# Patient Record
Sex: Male | Born: 1945
Health system: Southern US, Community
[De-identification: ages and names within clinical notes are randomized; demographics above are authoritative.]

## PROBLEM LIST (undated history)

## (undated) DIAGNOSIS — M1990 Unspecified osteoarthritis, unspecified site: Secondary | ICD-10-CM

## (undated) DIAGNOSIS — C189 Malignant neoplasm of colon, unspecified: Secondary | ICD-10-CM

## (undated) DIAGNOSIS — E1165 Type 2 diabetes mellitus with hyperglycemia: Secondary | ICD-10-CM

## (undated) DIAGNOSIS — N529 Male erectile dysfunction, unspecified: Secondary | ICD-10-CM

## (undated) DIAGNOSIS — K635 Polyp of colon: Secondary | ICD-10-CM

## (undated) DIAGNOSIS — R011 Cardiac murmur, unspecified: Secondary | ICD-10-CM

## (undated) DIAGNOSIS — I10 Essential (primary) hypertension: Secondary | ICD-10-CM

## (undated) HISTORY — PX: COLONOSCOPY: SHX174

## (undated) HISTORY — DX: Malignant neoplasm of colon, unspecified: C18.9

## (undated) HISTORY — DX: Type 2 diabetes mellitus with hyperglycemia: E11.65

## (undated) HISTORY — DX: Polyp of colon: K63.5

## (undated) HISTORY — DX: Cardiac murmur, unspecified: R01.1

## (undated) HISTORY — DX: Male erectile dysfunction, unspecified: N52.9

## (undated) HISTORY — DX: Unspecified osteoarthritis, unspecified site: M19.90

## (undated) HISTORY — DX: Essential (primary) hypertension: I10

---

## 1978-01-05 HISTORY — PX: HERNIA REPAIR: SHX51

## 2006-01-05 DIAGNOSIS — C189 Malignant neoplasm of colon, unspecified: Secondary | ICD-10-CM

## 2006-01-05 HISTORY — DX: Malignant neoplasm of colon, unspecified: C18.9

## 2006-01-19 HISTORY — PX: OTHER SURGICAL HISTORY: SHX169

## 2009-12-05 LAB — HM COLONOSCOPY: HM Colonoscopy: ABNORMAL

## 2010-05-05 ENCOUNTER — Other Ambulatory Visit (INDEPENDENT_AMBULATORY_CARE_PROVIDER_SITE_OTHER): Payer: BC Managed Care – PPO | Admitting: Internal Medicine

## 2010-05-05 ENCOUNTER — Ambulatory Visit (INDEPENDENT_AMBULATORY_CARE_PROVIDER_SITE_OTHER): Payer: BC Managed Care – PPO | Admitting: Internal Medicine

## 2010-05-05 ENCOUNTER — Other Ambulatory Visit (INDEPENDENT_AMBULATORY_CARE_PROVIDER_SITE_OTHER): Payer: BC Managed Care – PPO

## 2010-05-05 ENCOUNTER — Encounter: Payer: Self-pay | Admitting: Internal Medicine

## 2010-05-05 VITALS — BP 110/64 | HR 96 | Temp 97.5°F | Ht 70.0 in | Wt 256.2 lb

## 2010-05-05 DIAGNOSIS — IMO0001 Reserved for inherently not codable concepts without codable children: Secondary | ICD-10-CM

## 2010-05-05 DIAGNOSIS — Z13220 Encounter for screening for lipoid disorders: Secondary | ICD-10-CM

## 2010-05-05 DIAGNOSIS — Z0000 Encounter for general adult medical examination without abnormal findings: Secondary | ICD-10-CM

## 2010-05-05 DIAGNOSIS — I10 Essential (primary) hypertension: Secondary | ICD-10-CM | POA: Insufficient documentation

## 2010-05-05 DIAGNOSIS — N529 Male erectile dysfunction, unspecified: Secondary | ICD-10-CM

## 2010-05-05 DIAGNOSIS — K635 Polyp of colon: Secondary | ICD-10-CM | POA: Insufficient documentation

## 2010-05-05 DIAGNOSIS — C189 Malignant neoplasm of colon, unspecified: Secondary | ICD-10-CM | POA: Insufficient documentation

## 2010-05-05 DIAGNOSIS — M1990 Unspecified osteoarthritis, unspecified site: Secondary | ICD-10-CM

## 2010-05-05 DIAGNOSIS — R011 Cardiac murmur, unspecified: Secondary | ICD-10-CM | POA: Insufficient documentation

## 2010-05-05 DIAGNOSIS — E119 Type 2 diabetes mellitus without complications: Secondary | ICD-10-CM | POA: Insufficient documentation

## 2010-05-05 HISTORY — DX: Unspecified osteoarthritis, unspecified site: M19.90

## 2010-05-05 HISTORY — DX: Male erectile dysfunction, unspecified: N52.9

## 2010-05-05 HISTORY — DX: Reserved for inherently not codable concepts without codable children: IMO0001

## 2010-05-05 LAB — CBC WITH DIFFERENTIAL/PLATELET
Eosinophils Relative: 3.1 % (ref 0.0–5.0)
HCT: 44.1 % (ref 39.0–52.0)
Hemoglobin: 15.5 g/dL (ref 13.0–17.0)
Lymphs Abs: 2.3 10*3/uL (ref 0.7–4.0)
Monocytes Relative: 8 % (ref 3.0–12.0)
Platelets: 214 10*3/uL (ref 150.0–400.0)
WBC: 8.3 10*3/uL (ref 4.5–10.5)

## 2010-05-05 LAB — HEPATIC FUNCTION PANEL
ALT: 48 U/L (ref 0–53)
AST: 37 U/L (ref 0–37)
Bilirubin, Direct: 0.1 mg/dL (ref 0.0–0.3)
Total Bilirubin: 0.7 mg/dL (ref 0.3–1.2)

## 2010-05-05 LAB — URINALYSIS, ROUTINE W REFLEX MICROSCOPIC
Leukocytes, UA: NEGATIVE
Nitrite: NEGATIVE
Specific Gravity, Urine: >=1.03 (ref 1.000–1.030)
Urine Glucose: NEGATIVE
Urobilinogen, UA: 0.2 (ref 0.0–1.0)

## 2010-05-05 LAB — BASIC METABOLIC PANEL
BUN: 24 mg/dL — ABNORMAL HIGH (ref 6–23)
Chloride: 105 mEq/L (ref 96–112)
GFR: 70.02 mL/min (ref >60.00)
Potassium: 4.4 mEq/L (ref 3.5–5.1)
Sodium: 137 mEq/L (ref 135–145)

## 2010-05-05 LAB — TSH: TSH: 0.72 u[IU]/mL (ref 0.35–5.50)

## 2010-05-05 LAB — LIPID PANEL
HDL: 29.5 mg/dL — ABNORMAL LOW (ref >39.00)
Total CHOL/HDL Ratio: 5
VLDL: 76 mg/dL — ABNORMAL HIGH (ref 0.0–40.0)

## 2010-05-05 LAB — PSA: PSA: 0.52 ng/mL (ref 0.10–4.00)

## 2010-05-05 LAB — LDL CHOLESTEROL, DIRECT: Direct LDL: 78.1 mg/dL

## 2010-05-05 LAB — MICROALBUMIN / CREATININE URINE RATIO: Microalb Creat Ratio: 1.2 mg/g (ref 0.0–30.0)

## 2010-05-05 MED ORDER — TRAMADOL HCL 50 MG PO TABS: 50.0000 mg | ORAL_TABLET | Freq: Four times a day (QID) | ORAL | Status: AC | PRN

## 2010-05-05 MED ORDER — METFORMIN HCL ER 500 MG PO TB24: 1000.0000 mg | ORAL_TABLET | Freq: Every day | ORAL | Status: DC

## 2010-05-05 MED ORDER — TADALAFIL 20 MG PO TABS: 20.0000 mg | ORAL_TABLET | Freq: Every day | ORAL | Status: DC | PRN

## 2010-05-05 MED ORDER — TADALAFIL 20 MG PO TABS: 20.0000 mg | ORAL_TABLET | Freq: Every day | ORAL | Status: AC | PRN

## 2010-05-05 NOTE — Progress Notes (Signed)
Subjective:    Patient ID: Dakota Abbott, male    DOB: March 15, 1945, 65 y.o.   MRN: [id number]  HPI Here for wellness and f/u;  Overall doing ok;  Pt denies CP, worsening SOB, DOE, wheezing, orthopnea, PND, worsening LE edema, palpitations, dizziness or syncope.  Pt denies neurological change such as new Headache, facial or extremity weakness.  Pt denies polydipsia, polyuria, or low sugar symptoms. Pt states overall good compliance with treatment and medications, good tolerability, and trying to follow lower cholesterol diet.  Pt denies worsening depressive symptoms, suicidal ideation or panic. No fever, wt loss, night sweats, loss of appetite, or other constitutional symptoms.  Pt states good ability with ADL's, low fall risk, home safety reviewed and adequate, no significant changes in hearing or vision, and occasionally active with exercise.  Did have glimeparide added per last PCP last yr.  Also request pain med for DJD, as well as cialis for ED symptoms worse in the past 6 mo Past Medical History  Diagnosis Date  . Colon polyps     due for f/u colonoscopy march 2015; Dr Dayton Bailiff  . Arthritis   . Diabetes mellitus   . Heart murmur   . Hypertension   . Colon cancer   . Type II or unspecified type diabetes mellitus without mention of complication, uncontrolled 05/05/2010  . Degenerative joint disease 05/05/2010  . Erectile dysfunction 05/05/2010   Past Surgical History  Procedure Date  . Colon surgury 01/19/2006    reports that he has quit smoking. He does not have any smokeless tobacco history on file. He reports that he does not drink alcohol or use illicit drugs. family history includes Arthritis in his father and mother; Diabetes in his sister; Heart disease in his father and mother; and Hypertension in his father and mother. No Known Allergies No current outpatient prescriptions on file prior to visit.   Review of Systems Review of Systems  Constitutional: Negative for diaphoresis,  activity change, appetite change and unexpected weight change.  HENT: Negative for hearing loss, ear pain, facial swelling, mouth sores and neck stiffness.   Eyes: Negative for pain, redness and visual disturbance.  Respiratory: Negative for shortness of breath and wheezing.   Cardiovascular: Negative for chest pain and palpitations.  Gastrointestinal: Negative for diarrhea, blood in stool, abdominal distention and rectal pain.  Genitourinary: Negative for hematuria, flank pain and decreased urine volume.  Musculoskeletal: Negative for myalgias and joint swelling.  Skin: Negative for color change and wound.  Neurological: Negative for syncope and numbness.  Hematological: Negative for adenopathy.  Psychiatric/Behavioral: Negative for hallucinations, self-injury, decreased concentration and agitation.      Objective:   Physical Exam BP 110/64  Pulse 96  Temp(Src) 97.5 F (36.4 C) (Oral)  Ht 5\' 10"  (1.778 m)  Wt 256 lb 4 oz (116.234 kg)  BMI 36.77 kg/m2  SpO2 95% Physical Exam  VS noted, obese Constitutional: Pt is oriented to person, place, and time. Appears well-developed and well-nourished.  HENT:  Head: Normocephalic and atraumatic.  Right Ear: External ear normal.  Left Ear: External ear normal.  Nose: Nose normal.  Mouth/Throat: Oropharynx is clear and moist.  Eyes: Conjunctivae and EOM are normal. Pupils are equal, round, and reactive to light.  Neck: Normal range of motion. Neck supple. No JVD present. No tracheal deviation present.  Cardiovascular: Normal rate, regular rhythm, normal heart sounds and intact distal pulses.   Pulmonary/Chest: Effort normal and breath sounds normal.  Abdominal: Soft. Bowel sounds are normal.  There is no tenderness.  Musculoskeletal: Normal range of motion. Exhibits no edema.  Lymphadenopathy:  Has no cervical adenopathy.  Neurological: Pt is alert and oriented to person, place, and time. Pt has normal reflexes. No cranial nerve deficit.    Skin: Skin is warm and dry. No rash noted.  Psychiatric:  Has  normal mood and affect. Behavior is normal.         Assessment & Plan:

## 2010-05-05 NOTE — Assessment & Plan Note (Signed)
Mild to mod knees, back and shoulders - for tramadol prn

## 2010-05-05 NOTE — Assessment & Plan Note (Signed)
Due for oncology f/u July 2012 - will refer

## 2010-05-05 NOTE — Patient Instructions (Addendum)
You will be contacted regarding the referral for: the oncology appt for July 2012 Please go to LAB in the Basement for the blood and/or urine tests to be done today Please call the number on the [employer] Card (the [employer]) for results of testing in 2-3 days You are due for colonscopy next in December 2014 Take all new medications as prescribed Continue all other medications as before Please return in 1 year for your yearly visit, or sooner if needed, with Lab testing done 3-5 days before

## 2010-05-05 NOTE — Assessment & Plan Note (Signed)

## 2010-05-05 NOTE — Assessment & Plan Note (Signed)
For cialis prn 

## 2010-05-05 NOTE — Assessment & Plan Note (Signed)
stable overall by hx and exam, most recent lab reviewed with pt, and pt to continue medical treatment as before, for labs today

## 2010-06-30 ENCOUNTER — Encounter: Payer: BC Managed Care – PPO | Admitting: Oncology

## 2010-07-31 ENCOUNTER — Other Ambulatory Visit: Payer: Self-pay

## 2010-07-31 MED ORDER — AMLODIPINE BESYLATE 5 MG PO TABS: 5.0000 mg | ORAL_TABLET | Freq: Every day | ORAL | Status: DC

## 2010-09-29 ENCOUNTER — Other Ambulatory Visit: Payer: Self-pay | Admitting: Internal Medicine

## 2010-09-29 NOTE — Telephone Encounter (Signed)
Pneumovax rx sent to [facility]

## 2010-10-22 ENCOUNTER — Telehealth: Payer: Self-pay | Admitting: *Deleted

## 2010-10-22 NOTE — Telephone Encounter (Signed)
Pt spouse left message stating that pt has been irregular lately for about a 3 weeks and has some constipation-pt does have previous history of colon cancer and wife is asking MD's advisement on what pt can do to become more regular (pt does take Fiber daily per wife)

## 2010-10-22 NOTE — Telephone Encounter (Signed)
Trying to stay active, avoid dehydration, regular meals with more roughage and fiber, metamucil daily, and take miralax OTC daily

## 2010-10-23 NOTE — Telephone Encounter (Signed)
Marcelino Duster advised of MD's recommendations

## 2010-10-27 ENCOUNTER — Telehealth: Payer: Self-pay

## 2010-10-27 NOTE — Telephone Encounter (Signed)
Patients wife call to question if patient would need a referral to GI based on phone note from 10/22/2010. Also the patients wife is requesting something for weight loss for herself. Please advise, call back number is [phone]

## 2010-11-11 ENCOUNTER — Telehealth: Payer: Self-pay | Admitting: Oncology

## 2010-11-11 NOTE — Telephone Encounter (Signed)
Pt's wife called in to r/s her husband's new apt appt that have cancelled. R/s the appts with the pt's wife for jan at their request.

## 2010-11-12 ENCOUNTER — Other Ambulatory Visit: Payer: Self-pay

## 2010-11-12 MED ORDER — LOSARTAN POTASSIUM 100 MG PO TABS: 100.0000 mg | ORAL_TABLET | Freq: Every day | ORAL | Status: DC

## 2010-11-12 MED ORDER — GLIMEPIRIDE 4 MG PO TABS: 4.0000 mg | ORAL_TABLET | Freq: Every day | ORAL | Status: DC

## 2010-11-25 ENCOUNTER — Telehealth: Payer: Self-pay

## 2010-11-25 MED ORDER — METFORMIN HCL ER 500 MG PO TB24: ORAL_TABLET | ORAL | Status: DC

## 2010-11-25 NOTE — Telephone Encounter (Signed)
I think you sent this to the wrong person

## 2010-11-25 NOTE — Telephone Encounter (Signed)
Patients wife called to inform the patient takes Metformin 500 ER 1000 every morning and 1000 mg every evening. Please advise as prescription was for 500 every morning and 500 mg every evening.

## 2010-11-25 NOTE — Telephone Encounter (Signed)
Adjusted medication per patient and MD.

## 2010-11-25 NOTE — Telephone Encounter (Signed)
Ok to adjust rx as above - to robin to handle

## 2011-01-07 ENCOUNTER — Other Ambulatory Visit: Payer: Self-pay

## 2011-01-07 MED ORDER — METFORMIN HCL ER 500 MG PO TB24: ORAL_TABLET | ORAL | Status: DC

## 2011-01-07 MED ORDER — AMLODIPINE BESYLATE 5 MG PO TABS: 5.0000 mg | ORAL_TABLET | Freq: Every day | ORAL | Status: DC

## 2011-01-07 MED ORDER — LOSARTAN POTASSIUM 100 MG PO TABS: 100.0000 mg | ORAL_TABLET | Freq: Every day | ORAL | Status: DC

## 2011-01-07 MED ORDER — GLIMEPIRIDE 4 MG PO TABS: 4.0000 mg | ORAL_TABLET | Freq: Every day | ORAL | Status: DC

## 2011-01-30 ENCOUNTER — Telehealth: Payer: Self-pay | Admitting: Oncology

## 2011-01-30 NOTE — Telephone Encounter (Signed)
ptrs wife called lmovm that pts appt on 01/28 has to be r/s, rtn call lmovm to rtn call to r/s appt

## 2011-02-02 ENCOUNTER — Ambulatory Visit: Payer: BC Managed Care – PPO

## 2011-02-02 ENCOUNTER — Ambulatory Visit: Payer: BC Managed Care – PPO | Admitting: Oncology

## 2011-02-02 ENCOUNTER — Other Ambulatory Visit: Payer: BC Managed Care – PPO | Admitting: Lab

## 2011-02-02 ENCOUNTER — Telehealth: Payer: Self-pay | Admitting: Oncology

## 2011-02-02 NOTE — Telephone Encounter (Signed)
called pts wife and r/s missed appt for 01/28 to 02/12

## 2011-02-13 ENCOUNTER — Telehealth: Payer: Self-pay | Admitting: *Deleted

## 2011-02-13 NOTE — Telephone Encounter (Signed)
Per Dr. Kalman Drape request, called pt's wife to confirm appt for 02/17/11. No answer. Left message for her to call office to confirm.

## 2011-02-13 NOTE — Telephone Encounter (Signed)
Message from pt's wife, they plan to keep appt for 02/17/11.

## 2011-02-16 ENCOUNTER — Other Ambulatory Visit: Payer: Self-pay | Admitting: *Deleted

## 2011-02-16 DIAGNOSIS — C189 Malignant neoplasm of colon, unspecified: Secondary | ICD-10-CM

## 2011-02-17 ENCOUNTER — Ambulatory Visit (HOSPITAL_BASED_OUTPATIENT_CLINIC_OR_DEPARTMENT_OTHER): Payer: BC Managed Care – PPO | Admitting: Oncology

## 2011-02-17 ENCOUNTER — Encounter: Payer: Self-pay | Admitting: Oncology

## 2011-02-17 ENCOUNTER — Other Ambulatory Visit: Payer: BC Managed Care – PPO | Admitting: Lab

## 2011-02-17 ENCOUNTER — Telehealth: Payer: Self-pay

## 2011-02-17 ENCOUNTER — Ambulatory Visit: Payer: BC Managed Care – PPO

## 2011-02-17 ENCOUNTER — Other Ambulatory Visit: Payer: Self-pay | Admitting: *Deleted

## 2011-02-17 VITALS — BP 125/65 | HR 86 | Temp 97.1°F | Ht 71.0 in | Wt 262.5 lb

## 2011-02-17 DIAGNOSIS — C189 Malignant neoplasm of colon, unspecified: Secondary | ICD-10-CM

## 2011-02-17 DIAGNOSIS — C187 Malignant neoplasm of sigmoid colon: Secondary | ICD-10-CM

## 2011-02-17 DIAGNOSIS — E119 Type 2 diabetes mellitus without complications: Secondary | ICD-10-CM

## 2011-02-17 LAB — CBC WITH DIFFERENTIAL/PLATELET
Eosinophils Absolute: 0.3 10*3/uL (ref 0.0–0.5)
HGB: 14.8 g/dL (ref 13.0–17.1)
MONO#: 0.6 10*3/uL (ref 0.1–0.9)
MONO%: 7.3 % (ref 0.0–14.0)
NEUT#: 5.4 10*3/uL (ref 1.5–6.5)
RBC: 4.5 10*6/uL (ref 4.20–5.82)
RDW: 12.6 % (ref 11.0–14.6)
WBC: 8.3 10*3/uL (ref 4.0–10.3)
lymph#: 2 10*3/uL (ref 0.9–3.3)

## 2011-02-17 NOTE — Telephone Encounter (Signed)
Are we sure the request is for testosterone;  I though there was a mention of Muse I overheard from the phone discussion

## 2011-02-17 NOTE — Telephone Encounter (Signed)
Called the patient and she stated the MD in [place] said no more that 2-3 times per month. Patients wife stated is was a powder form

## 2011-02-17 NOTE — Progress Notes (Signed)
Referring MD: Susie Cassette 66 y.o.  08-Oct-1945    Reason for Referral: History of colon cancer.     HPI: He was diagnosed with colon cancer in January of 2008 after presenting with diarrhea and rectal bleeding. He underwent a colonoscopy that confirmed a mass at 25 cm. He was taken to the operating room on 01/19/2006 and underwent a partial colectomy.the mass was found in the sigmoid colon. The pathology revealed a tumor at the rectal sigmoid colon measuring 9.2 cm in greatest dimension. The tumor was a moderately differentiated adenocarcinoma. The tumor was characterized as a T3 N0 lesion with a total of 15 negative lymph nodes. The surgical margins were negative. Lymphatic and venous invasion were absent. Perineural invasion was present.a markedly lymphocytic response was noted including a "Crohn's like response ". A hyperplastic polyp and a "adenoma "were also noted in the surgical specimen the  He did not receive adjuvant therapy. He has been followed  With surveillance colonoscopies.  He reports feeling well at present.   Past Medical History  Diagnosis Date  . Colon polyps     due for f/u colonoscopy march 2015; Dr Dayton Bailiff  . Arthritis   . Heart murmur   . Hypertension   . Colon cancer (T3 N0)   January 2008   . Type II or unspecified type diabetes mellitus without mention of complication, uncontrolled 05/05/2010  . Degenerative joint disease 05/05/2010  . Erectile dysfunction 05/05/2010  . Diabetes mellitus     Past Surgical History  Procedure Date  . Colon surgury 01/19/2006   .right inguinal hernia repair   Family History  Problem Relation Age of Onset  . Arthritis Mother   . Heart disease Mother   . Hypertension Mother   . Arthritis Father   . Heart disease Father   . Hypertension Father   . Diabetes Sister    . His father died at age 73, he believes he may have had colon cancer  . No other family history of cancer, he has 2 daughters, one  brother, and one sister. One brother is deceased. Current outpatient prescriptions:amLODipine (NORVASC) 5 MG tablet, Take 1 tablet (5 mg total) by mouth daily., Disp: 90 tablet, Rfl: 3;  aspirin 81 MG EC tablet, Take 81 mg by mouth daily.  , Disp: , Rfl: ;  glimepiride (AMARYL) 4 MG tablet, Take 1 tablet (4 mg total) by mouth daily., Disp: 90 tablet, Rfl: 2;  losartan (COZAAR) 100 MG tablet, Take 1 tablet (100 mg total) by mouth daily., Disp: 90 tablet, Rfl: 2 metFORMIN (GLUCOPHAGE-XR) 500 MG 24 hr tablet, Take 1000 mg every morning and 1000 mg every evening., Disp: 120 tablet, Rfl: 11;  Multiple Vitamin (MULTIVITAMIN) tablet, Take 1 tablet by mouth daily.  , Disp: , Rfl: ;  traMADol (ULTRAM) 50 MG tablet, Take 1 tablet (50 mg total) by mouth every 6 (six) hours as needed for pain., Disp: 120 tablet, Rfl: 3  Allergies: No Known Allergies  Social History:  he lives with his wife in [place]. He works as a Engineer, maintenance. He quit smoking cigarettes 8 years ago. He does not use alcohol. He has no transfusion history. He denies risk factors for HIV and hepatitis. He was in the [employer] in [place].    History  Alcohol Use No    History  Smoking status  . Former Smoker  Smokeless tobacco  . Not on file       ROS:  Positives include: occasional constipation, relieved with increased water intake and yogurt  A complete ROS was otherwise negative.  Physical Exam:  Blood pressure 125/65, pulse 86, temperature 97.1 F (36.2 C), temperature source Oral, height 5\' 11"  (1.803 m), weight 262 lb 8 oz (119.069 kg).  HEENT:  upper denture plate, oropharynx without visible mass, neck without mass Lungs:  end inspiratory bronchial sounds at the upper posterior chest bilaterally. No respiratory distress  Cardiac:  Regular rate and rhythm, 2/6 systolic murmur heard best at the apex and left anterolateral chest Abdomen:  nontender, no hepatomegaly, no mass, no apparent ascites GU:  testes  without mass, the right testicle is smaller than the left   Vascular: No leg  Lymph nodes: No cervical, supraclavicular, axillary, or inguinal lymph nodes  Neurologic:  alert and oriented. The motor examination appears grossly intact.    LAB:  CBC  Lab Results  Component Value Date   WBC 8.3 02/17/2011   HGB 14.8 02/17/2011   HCT 42.2 02/17/2011   MCV 93.7 02/17/2011   PLT 171 02/17/2011     CEA- pending   Assessment/Plan:   1. Stage II (T3 N0) adenocarcinoma of the sigmoid colon, status post a partial colectomy in January of 2008  2. Marked lymphocytic response and " Crohn like "response noted on the January 2008 surgical pathology   3. Diabetes mellitus  4.? History of colon cancer in his father   Disposition:   He was diagnosed with stage II colon cancer 5 years ago. He remains in clinical remission. He should continue surveillance colonoscopies. We will check a CEA today. He has an excellent prognosis for a long-term disease-free survival.  His father may have had colon cancer and a significant lymphocytic response was noted on the 2008 colon cancer. This could indicate a hereditary non-polyposis colon cancer syndrome. We will submit peripheral blood testing for hereditary non-polyposis colon cancer. We will contact him with these results and arrange for a genetics  Counseling referral as indicated. He will alert family members of his colon cancer diagnosis so they can obtain a screening colonoscopy.  He plans to continue clinical followup with Dr. Jonny Ruiz and surveillance colonoscopies at [facility] gastro[facility]. We did not schedule a followup appointment at cancer Center. We will see him in the future as needed.   Ilyanna Baillargeon BRADLEY 02/17/2011, 2:59 PM

## 2011-02-17 NOTE — Telephone Encounter (Signed)
Patients wife called to inquire the patient starting back on testosterone treatment (injections, she stated was a powder form) that he had 1 year ago in [place]. Please advise if the patient needs OV? Call back number is [phone]

## 2011-02-17 NOTE — Progress Notes (Signed)
Checks glucose 2-3 times daily: ranges 90's to 140's

## 2011-02-17 NOTE — Telephone Encounter (Signed)
Can she let us know how often the injection was done? (usually every 2 wks, or 4 wks)

## 2011-02-18 ENCOUNTER — Other Ambulatory Visit: Payer: Self-pay | Admitting: *Deleted

## 2011-02-18 DIAGNOSIS — C189 Malignant neoplasm of colon, unspecified: Secondary | ICD-10-CM

## 2011-02-18 NOTE — Telephone Encounter (Signed)
Called the patient and she will call the pharmacy where medication was filled to get name, dose and instructions.

## 2011-02-18 NOTE — Telephone Encounter (Signed)
Called left message to call back 

## 2011-02-18 NOTE — Progress Notes (Signed)
Dr. Truett Perna called patient to discuss his past clinical history in more detail and felt it would be of benefit to retest for CEA and draw peripheral blood and have tested for [facility] testing Dakota Abbott). Patient agrees. Scheduler notified.

## 2011-02-19 ENCOUNTER — Telehealth: Payer: Self-pay | Admitting: Oncology

## 2011-02-19 MED ORDER — ALPROSTADIL (VASODILATOR) 250 MCG UR PLLT: 250.0000 ug | PELLET | URETHRAL | Status: DC | PRN

## 2011-02-19 NOTE — Telephone Encounter (Signed)
called pts wife to scheduled lab for today and she stated that they were on there out of town and will rtn on 03/04.  scheduled lab appt for 03/04

## 2011-02-19 NOTE — Telephone Encounter (Signed)
The patients wife called back and the name of the medication is Muse. She does not know the amount only stated the maximum amount, please advise.

## 2011-02-19 NOTE — Telephone Encounter (Signed)
To be clear, MUSE is a penile suppository medication for use only for ED, on a prn basis  ok for rx, though please be aware there can be difficulty with coverage by some insurances  It comes in 3 different strengths - ok for medium strength I should think to start;Done hardcopy to robin to give to pt as they may wish to have filled locally first

## 2011-02-19 NOTE — Telephone Encounter (Signed)
Called the patients wife informed of information and faxed hardcopy to pharmacy.

## 2011-02-23 NOTE — Progress Notes (Signed)
Patient was a no-show 

## 2011-03-09 ENCOUNTER — Other Ambulatory Visit: Payer: BC Managed Care – PPO | Admitting: Lab

## 2011-05-08 ENCOUNTER — Encounter: Payer: BC Managed Care – PPO | Admitting: Internal Medicine

## 2011-06-15 ENCOUNTER — Other Ambulatory Visit: Payer: Self-pay

## 2011-06-15 ENCOUNTER — Other Ambulatory Visit: Payer: Self-pay | Admitting: Internal Medicine

## 2011-06-15 NOTE — Telephone Encounter (Signed)
Denied-Rx to [facility] 02.14.13 #[id number]-too soon to fill/SLS

## 2011-06-15 NOTE — Telephone Encounter (Signed)
Spouse called requesting Rx for Muse be sent to [facility]. Refill request received was denied because per [facility] hardcopy Rx was faxed to [facility] when it actually went to local. Raynelle Fanning at [facility] states that Rx was received but with no refills. Authorization was given for 12 additional fills. Spouse advised

## 2011-06-16 ENCOUNTER — Other Ambulatory Visit: Payer: Self-pay | Admitting: Internal Medicine

## 2011-07-13 ENCOUNTER — Encounter: Payer: BC Managed Care – PPO | Admitting: Internal Medicine

## 2011-07-29 ENCOUNTER — Other Ambulatory Visit: Payer: Self-pay | Admitting: Internal Medicine

## 2011-08-05 ENCOUNTER — Other Ambulatory Visit: Payer: Self-pay | Admitting: Internal Medicine

## 2011-08-15 ENCOUNTER — Other Ambulatory Visit: Payer: Self-pay | Admitting: Internal Medicine

## 2011-08-17 NOTE — Telephone Encounter (Signed)
Done erx  Dakota Abbott to contact pt - needs OV

## 2011-08-18 ENCOUNTER — Other Ambulatory Visit: Payer: Self-pay | Admitting: Internal Medicine

## 2011-09-09 ENCOUNTER — Other Ambulatory Visit: Payer: Self-pay | Admitting: Internal Medicine

## 2011-09-25 ENCOUNTER — Other Ambulatory Visit: Payer: Self-pay | Admitting: Internal Medicine

## 2011-09-29 ENCOUNTER — Encounter: Payer: BC Managed Care – PPO | Admitting: Internal Medicine

## 2011-10-11 ENCOUNTER — Other Ambulatory Visit: Payer: Self-pay | Admitting: Internal Medicine

## 2011-11-02 ENCOUNTER — Encounter: Payer: BC Managed Care – PPO | Admitting: Internal Medicine

## 2011-11-11 ENCOUNTER — Other Ambulatory Visit: Payer: Self-pay

## 2011-11-11 MED ORDER — ASPIRIN 81 MG PO TBEC: 81.0000 mg | DELAYED_RELEASE_TABLET | Freq: Every day | ORAL | Status: DC

## 2011-11-11 MED ORDER — GLIMEPIRIDE 4 MG PO TABS: 4.0000 mg | ORAL_TABLET | Freq: Every day | ORAL | Status: DC

## 2011-12-07 ENCOUNTER — Encounter: Payer: Self-pay | Admitting: Internal Medicine

## 2011-12-07 ENCOUNTER — Other Ambulatory Visit (INDEPENDENT_AMBULATORY_CARE_PROVIDER_SITE_OTHER): Payer: BC Managed Care – PPO

## 2011-12-07 ENCOUNTER — Ambulatory Visit (INDEPENDENT_AMBULATORY_CARE_PROVIDER_SITE_OTHER): Payer: BC Managed Care – PPO | Admitting: Internal Medicine

## 2011-12-07 VITALS — BP 122/70 | HR 91 | Temp 98.7°F | Ht 70.0 in | Wt 264.4 lb

## 2011-12-07 DIAGNOSIS — E669 Obesity, unspecified: Secondary | ICD-10-CM | POA: Insufficient documentation

## 2011-12-07 DIAGNOSIS — Z2911 Encounter for prophylactic immunotherapy for respiratory syncytial virus (RSV): Secondary | ICD-10-CM

## 2011-12-07 DIAGNOSIS — C189 Malignant neoplasm of colon, unspecified: Secondary | ICD-10-CM

## 2011-12-07 DIAGNOSIS — Z23 Encounter for immunization: Secondary | ICD-10-CM

## 2011-12-07 DIAGNOSIS — Z0000 Encounter for general adult medical examination without abnormal findings: Secondary | ICD-10-CM

## 2011-12-07 LAB — CBC WITH DIFFERENTIAL/PLATELET
Basophils Relative: 0.6 % (ref 0.0–3.0)
Eosinophils Relative: 3.3 % (ref 0.0–5.0)
HCT: 43.2 % (ref 39.0–52.0)
Hemoglobin: 14.9 g/dL (ref 13.0–17.0)
Lymphs Abs: 1.7 10*3/uL (ref 0.7–4.0)
MCV: 96.5 fl (ref 78.0–100.0)
Monocytes Absolute: 0.5 10*3/uL (ref 0.1–1.0)
Neutro Abs: 5.5 10*3/uL (ref 1.4–7.7)
Neutrophils Relative %: 68.7 % (ref 43.0–77.0)
RBC: 4.48 Mil/uL (ref 4.22–5.81)
WBC: 8 10*3/uL (ref 4.5–10.5)

## 2011-12-07 LAB — LDL CHOLESTEROL, DIRECT: Direct LDL: 85.5 mg/dL

## 2011-12-07 LAB — BASIC METABOLIC PANEL
BUN: 19 mg/dL (ref 6–23)
Calcium: 9.6 mg/dL (ref 8.4–10.5)
Creatinine, Ser: 1.1 mg/dL (ref 0.4–1.5)
GFR: 71.9 mL/min (ref >60.00)

## 2011-12-07 LAB — LIPID PANEL
Cholesterol: 169 mg/dL (ref 0–200)
HDL: 30.3 mg/dL — ABNORMAL LOW (ref >39.00)
Total CHOL/HDL Ratio: 6
VLDL: 111 mg/dL — ABNORMAL HIGH (ref 0.0–40.0)

## 2011-12-07 LAB — TSH: TSH: 0.48 u[IU]/mL (ref 0.35–5.50)

## 2011-12-07 LAB — HEMOGLOBIN A1C: Hgb A1c MFr Bld: 6.3 % (ref 4.6–6.5)

## 2011-12-07 LAB — HEPATIC FUNCTION PANEL: Total Bilirubin: 0.7 mg/dL (ref 0.3–1.2)

## 2011-12-07 MED ORDER — PHENTERMINE HCL 37.5 MG PO CAPS: 37.5000 mg | ORAL_CAPSULE | ORAL | Status: DC

## 2011-12-07 NOTE — Assessment & Plan Note (Signed)
Ok for limited trial phentermine

## 2011-12-07 NOTE — Assessment & Plan Note (Signed)

## 2011-12-07 NOTE — Progress Notes (Signed)
Subjective:    Patient ID: Dakota Abbott, male    DOB: Sep 26, 1945, 66 y.o.   MRN: [id number]  HPI Here for wellness and f/u;  Overall doing ok;  Pt denies CP, worsening SOB, DOE, wheezing, orthopnea, PND, worsening LE edema, palpitations, dizziness or syncope.  Pt denies neurological change such as new Headache, facial or extremity weakness.  Pt denies polydipsia, polyuria, or low sugar symptoms. Pt states overall good compliance with treatment and medications, good tolerability, and trying to follow lower cholesterol diet.  Pt denies worsening depressive symptoms, suicidal ideation or panic. No fever, wt loss, night sweats, loss of appetite, or other constitutional symptoms.  Pt states good ability with ADL's, low fall risk, home safety reviewed and adequate, no significant changes in hearing or vision, and occasionally active with exercise.  No acute complaints Past Medical History  Diagnosis Date  . Colon polyps     due for f/u colonoscopy march 2015; Dr Dayton Bailiff  . Arthritis   . Heart murmur   . Hypertension   . Colon cancer   . Type II or unspecified type diabetes mellitus without mention of complication, uncontrolled 05/05/2010  . Degenerative joint disease 05/05/2010  . Erectile dysfunction 05/05/2010  . Diabetes mellitus    Past Surgical History  Procedure Date  . Colon surgury 01/19/2006    reports that he has quit smoking. He does not have any smokeless tobacco history on file. He reports that he does not drink alcohol or use illicit drugs. family history includes Arthritis in his father and mother; Diabetes in his sister; Heart disease in his father and mother; and Hypertension in his father and mother. No Known Allergies Current Outpatient Prescriptions on File Prior to Visit  Medication Sig Dispense Refill  . amLODipine (NORVASC) 5 MG tablet Take 1 tablet (5 mg total) by mouth daily.  90 tablet  3  . aspirin (RA ASPIRIN EC ADULT LOW ST) 81 MG EC tablet Take 1 tablet (81 mg  total) by mouth daily. Swallow whole.  30 tablet  11  . glimepiride (AMARYL) 4 MG tablet Take 1 tablet (4 mg total) by mouth daily before breakfast.  30 tablet  11  . losartan (COZAAR) 100 MG tablet TAKE 1 TABLET BY MOUTH DAILY  30 tablet  0  . metFORMIN (GLUCOPHAGE-XR) 500 MG 24 hr tablet Take 1000 mg every morning and 1000 mg every evening.  120 tablet  11  . Multiple Vitamin (MULTIVITAMIN) tablet Take 1 tablet by mouth daily.        . phentermine 37.5 MG capsule Take 1 capsule (37.5 mg total) by mouth every morning.  30 capsule  2   Review of Systems Review of Systems  Constitutional: Negative for diaphoresis, activity change, appetite change and unexpected weight change.  HENT: Negative for hearing loss, ear pain, facial swelling, mouth sores and neck stiffness.   Eyes: Negative for pain, redness and visual disturbance.  Respiratory: Negative for shortness of breath and wheezing.   Cardiovascular: Negative for chest pain and palpitations.  Gastrointestinal: Negative for diarrhea, blood in stool, abdominal distention and rectal pain.  Genitourinary: Negative for hematuria, flank pain and decreased urine volume.  Musculoskeletal: Negative for myalgias and joint swelling.  Skin: Negative for color change and wound.  Neurological: Negative for syncope and numbness.  Hematological: Negative for adenopathy.  Psychiatric/Behavioral: Negative for hallucinations, self-injury, decreased concentration and agitation.      Objective:   Physical Exam BP 122/70  Pulse 91  Temp 98.7 F (  37.1 C) (Oral)  Ht 5\' 10"  (1.778 m)  Wt 264 lb 6 oz (119.92 kg)  BMI 37.93 kg/m2  SpO2 96% Physical Exam  VS noted Constitutional: Pt is oriented to person, place, and time. Appears well-developed and well-nourished.  HENT:  Head: Normocephalic and atraumatic.  Right Ear: External ear normal.  Left Ear: External ear normal.  Nose: Nose normal.  Mouth/Throat: Oropharynx is clear and moist.  Eyes:  Conjunctivae and EOM are normal. Pupils are equal, round, and reactive to light.  Neck: Normal range of motion. Neck supple. No JVD present. No tracheal deviation present.  Cardiovascular: Normal rate, regular rhythm, normal heart sounds and intact distal pulses.   Pulmonary/Chest: Effort normal and breath sounds normal.  Abdominal: Soft. Bowel sounds are normal. There is no tenderness.  Musculoskeletal: Normal range of motion. Exhibits no edema.  Lymphadenopathy:  Has no cervical adenopathy.  Neurological: Pt is alert and oriented to person, place, and time. Pt has normal reflexes. No cranial nerve deficit.  Skin: Skin is warm and dry. No rash noted.  Psychiatric:  Has  normal mood and affect. Behavior is normal.     Assessment & Plan:

## 2011-12-07 NOTE — Assessment & Plan Note (Signed)
stable overall by hx and exam, most recent data reviewed with pt, and pt to continue medical treatment as before Lab Results  Component Value Date   HGBA1C 6.3 12/07/2011

## 2011-12-07 NOTE — Patient Instructions (Addendum)
Take all new medications as prescribed - the phentermine Continue all other medications as before Please have the pharmacy call with any refills you may need. Please continue your efforts at being more active, low cholesterol diet, and weight control. You had the shingles shot today You are otherwise up to date with prevention Please go to LAB in the Basement for the blood and/or urine tests to be done today You will be contacted by phone if any changes need to be made immediately.  Otherwise, you will receive a letter about your results with an explanation, but please check with MyChart first. Thank you for enrolling in MyChart. Please follow the instructions below to securely access your online medical record. MyChart allows you to send messages to your doctor, view your test results, renew your prescriptions, schedule appointments, and more. To Log into MyChart, please go to [email or web address], and your Username is: [email or web address] Please keep your appointments with your specialists as you have planned - your dentist to have the teeth pulled Please return in 6 mo with Lab testing done 3-5 days before

## 2011-12-07 NOTE — Assessment & Plan Note (Signed)
Ok for CEA level

## 2011-12-09 ENCOUNTER — Encounter: Payer: Self-pay | Admitting: Internal Medicine

## 2011-12-09 ENCOUNTER — Other Ambulatory Visit: Payer: Self-pay | Admitting: Internal Medicine

## 2011-12-09 MED ORDER — AMOXICILLIN 500 MG PO CAPS: 1000.0000 mg | ORAL_CAPSULE | Freq: Two times a day (BID) | ORAL | Status: DC

## 2011-12-21 ENCOUNTER — Encounter: Payer: BC Managed Care – PPO | Admitting: Internal Medicine

## 2011-12-31 ENCOUNTER — Other Ambulatory Visit: Payer: Self-pay | Admitting: Internal Medicine

## 2012-01-08 ENCOUNTER — Telehealth: Payer: Self-pay

## 2012-01-08 NOTE — Telephone Encounter (Signed)
The patient is having dental work on Jan. 14, 2014 and needs a letter stating he is cleared for this procedure.  THe wife stated we could mail it to their home if possible.  Please advise

## 2012-01-08 NOTE — Telephone Encounter (Signed)
Ok for note - to [employer]

## 2012-01-11 NOTE — Telephone Encounter (Signed)
Completed letter, called the patient to inform and mailed to the home of patient as requested.

## 2012-01-24 ENCOUNTER — Other Ambulatory Visit: Payer: Self-pay | Admitting: Internal Medicine

## 2012-01-24 ENCOUNTER — Encounter: Payer: Self-pay | Admitting: Internal Medicine

## 2012-01-25 ENCOUNTER — Encounter: Payer: Self-pay | Admitting: Internal Medicine

## 2012-01-25 ENCOUNTER — Telehealth: Payer: Self-pay

## 2012-01-25 ENCOUNTER — Other Ambulatory Visit: Payer: Self-pay

## 2012-01-25 ENCOUNTER — Other Ambulatory Visit: Payer: Self-pay | Admitting: Internal Medicine

## 2012-01-25 MED ORDER — LOSARTAN POTASSIUM 100 MG PO TABS: 100.0000 mg | ORAL_TABLET | Freq: Every day | ORAL | Status: DC

## 2012-01-25 MED ORDER — AMOXICILLIN 500 MG PO CAPS: ORAL_CAPSULE | ORAL | Status: DC

## 2012-01-25 NOTE — Telephone Encounter (Signed)
The patients wife called to request an antibiotic sent in to [facility].  There is a note in mychart as well.  States he may need something stronger than amoxicillin and for up to 2 weeks.  Please Francee Piccolo c all back number is [phone]

## 2012-01-25 NOTE — Telephone Encounter (Signed)
Prescription done and called the patients wife informed rx sent in.

## 2012-01-25 NOTE — Telephone Encounter (Signed)
Per pt email:  Ok will do ===View-only below this line===   ----- Message -----    From: Ernestene Kiel    Sent: 01/24/2012  8:55 PM EST      To: Oliver Barre, MD Subject: Non-Urgent Medical Question  Dr. Jonny Ruiz I have not been to my appointment with my dentist until next Tuesday Jan 28. My tooth is infected and very painful. Would u please call me in a prescription for Penicillin with enough to last for 2 weeks  if u can please call them into [facility] on [address]. The phone number is [phone]. Thank you Ernestene Kiel Sent from my iPhone

## 2012-01-25 NOTE — Telephone Encounter (Signed)
I would do the regular tx - already done erx

## 2012-02-11 ENCOUNTER — Other Ambulatory Visit: Payer: Self-pay | Admitting: Internal Medicine

## 2012-02-11 ENCOUNTER — Encounter: Payer: Self-pay | Admitting: Internal Medicine

## 2012-02-11 DIAGNOSIS — K5900 Constipation, unspecified: Secondary | ICD-10-CM

## 2012-02-11 DIAGNOSIS — Z85038 Personal history of other malignant neoplasm of large intestine: Secondary | ICD-10-CM

## 2012-02-16 ENCOUNTER — Encounter: Payer: Self-pay | Admitting: Internal Medicine

## 2012-02-24 ENCOUNTER — Other Ambulatory Visit: Payer: Self-pay | Admitting: Internal Medicine

## 2012-02-25 ENCOUNTER — Telehealth: Payer: Self-pay

## 2012-02-25 MED ORDER — METFORMIN HCL ER (OSM) 1000 MG PO TB24: 1000.0000 mg | ORAL_TABLET | Freq: Two times a day (BID) | ORAL | Status: DC

## 2012-02-25 NOTE — Telephone Encounter (Signed)
refill 

## 2012-03-07 ENCOUNTER — Encounter: Payer: Self-pay | Admitting: Internal Medicine

## 2012-03-09 ENCOUNTER — Ambulatory Visit: Payer: BC Managed Care – PPO | Admitting: Internal Medicine

## 2012-06-03 ENCOUNTER — Encounter: Payer: Self-pay | Admitting: Internal Medicine

## 2012-06-07 ENCOUNTER — Other Ambulatory Visit: Payer: Self-pay | Admitting: Internal Medicine

## 2012-06-07 ENCOUNTER — Other Ambulatory Visit: Payer: Self-pay

## 2012-06-07 MED ORDER — LOSARTAN POTASSIUM 100 MG PO TABS: 100.0000 mg | ORAL_TABLET | Freq: Every day | ORAL | Status: DC

## 2012-06-10 ENCOUNTER — Telehealth: Payer: Self-pay | Admitting: *Deleted

## 2012-06-10 NOTE — Telephone Encounter (Signed)
Robitussin DM ok

## 2012-06-10 NOTE — Telephone Encounter (Signed)
Pt wife states husband has a cough. Was coughing up yellow phelgm, headache, no fever. They are out on the road not able to go to any urgent care to be evaluated. Wanting to see what md recommend he can take over the counter. Started using robitussin last night for cough...Raechel Chute

## 2012-06-10 NOTE — Telephone Encounter (Signed)
Patient informed. 

## 2012-06-30 ENCOUNTER — Ambulatory Visit: Payer: BC Managed Care – PPO | Admitting: Internal Medicine

## 2012-07-06 ENCOUNTER — Ambulatory Visit: Payer: BC Managed Care – PPO | Admitting: Internal Medicine

## 2012-07-07 ENCOUNTER — Other Ambulatory Visit: Payer: Self-pay

## 2012-07-07 MED ORDER — ONE-DAILY MULTI VITAMINS PO TABS: 1.0000 | ORAL_TABLET | Freq: Every day | ORAL | Status: DC

## 2012-07-11 ENCOUNTER — Ambulatory Visit (INDEPENDENT_AMBULATORY_CARE_PROVIDER_SITE_OTHER): Payer: BC Managed Care – PPO | Admitting: Gastroenterology

## 2012-07-11 ENCOUNTER — Encounter: Payer: Self-pay | Admitting: Gastroenterology

## 2012-07-11 VITALS — BP 100/60 | HR 80 | Ht 70.0 in | Wt 263.0 lb

## 2012-07-11 DIAGNOSIS — D126 Benign neoplasm of colon, unspecified: Secondary | ICD-10-CM

## 2012-07-11 DIAGNOSIS — K635 Polyp of colon: Secondary | ICD-10-CM

## 2012-07-11 MED ORDER — NA SULFATE-K SULFATE-MG SULF 17.5-3.13-1.6 GM/177ML PO SOLN: 1.0000 | Freq: Once | ORAL | Status: DC

## 2012-07-11 NOTE — Progress Notes (Signed)
History of Present Illness: The patient is a 67 year old white male with history of colon cancer and colon polyps referred at the request of Dr. Jonny Ruiz for followup colonoscopy. Colon cancer was resected approximately 7 years ago. Last colonoscopy in 2011 demonstrated a hyperplastic and adenomatous polyp. Except for mild constipation he has no GI complaints. He denies rectal bleeding.    Past Medical History  Diagnosis Date  . Colon polyps     due for f/u colonoscopy march 2015; Dr Dayton Bailiff  . Arthritis   . Heart murmur   . Hypertension   . Colon cancer   . Type II or unspecified type diabetes mellitus without mention of complication, uncontrolled 05/05/2010  . Degenerative joint disease 05/05/2010  . Erectile dysfunction 05/05/2010  . Diabetes mellitus    Past Surgical History  Procedure Laterality Date  . Colon surgury  01/19/2006   family history includes Arthritis in his father and mother; Diabetes in his sister; Heart disease in his father and mother; and Hypertension in his father and mother. Current Outpatient Prescriptions  Medication Sig Dispense Refill  . amLODipine (NORVASC) 5 MG tablet TAKE 1 TABLET BY MOUTH DAILY  90 tablet  3  . amoxicillin (AMOXIL) 500 MG capsule TAKE 2 CAPSULES BY MOUTH TWICE DAILY  40 capsule  0  . aspirin (RA ASPIRIN EC ADULT LOW ST) 81 MG EC tablet Take 1 tablet (81 mg total) by mouth daily. Swallow whole.  30 tablet  11  . glimepiride (AMARYL) 4 MG tablet TAKE 1 TABLET BY MOUTH DAILY  90 tablet  3  . losartan (COZAAR) 100 MG tablet Take 1 tablet (100 mg total) by mouth daily.  90 tablet  2  . metformin (FORTAMET) 1000 MG (OSM) 24 hr tablet Take 1 tablet (1,000 mg total) by mouth 2 (two) times daily with a meal.  60 tablet  11  . metFORMIN (GLUCOPHAGE-XR) 500 MG 24 hr tablet TAKE 2 TABLETS (1000MG ) EVERY MORNING AND 2 TABLETS (1000MG ) EVERY EVENING  360 tablet  10  . Multiple Vitamin (MULTIVITAMIN) tablet Take 1 tablet by mouth daily.  30 tablet  5  .  phentermine 37.5 MG capsule Take 1 capsule (37.5 mg total) by mouth every morning.  30 capsule  2   No current facility-administered medications for this visit.   Allergies as of 07/11/2012 - Review Complete 07/11/2012  Allergen Reaction Noted  . Lisinopril Cough 07/11/2012    reports that he has quit smoking. He has never used smokeless tobacco. He reports that he does not drink alcohol or use illicit drugs.     Review of Systems: Pertinent positive and negative review of systems were noted in the above HPI section. All other review of systems were otherwise negative.  Vital signs were reviewed in today's medical record Physical Exam: General: Well developed , well nourished, no acute distress Skin: anicteric Head: Normocephalic and atraumatic Eyes:  sclerae anicteric, EOMI Ears: Normal auditory acuity Mouth: No deformity or lesions Neck: Supple, no masses or thyromegaly Lungs: Clear throughout to auscultation Heart: Regular rate and rhythm; no murmurs, rubs or bruits Abdomen: Soft, non tender and non distended. No masses, hepatosplenomegaly or hernias noted. Normal Bowel sounds Rectal:deferred Musculoskeletal: Symmetrical with no gross deformities  Skin: No lesions on visible extremities Pulses:  Normal pulses noted Extremities: No clubbing, cyanosis, edema or deformities noted Neurological: Alert oriented x 4, grossly nonfocal Cervical Nodes:  No significant cervical adenopathy Inguinal Nodes: No significant inguinal adenopathy Psychological:  Alert and cooperative. Normal  mood and affect

## 2012-07-11 NOTE — Assessment & Plan Note (Signed)
By history adenomatous and hyperplastic polyps in 2011. Plan followup colonoscopy

## 2012-07-11 NOTE — Patient Instructions (Addendum)

## 2012-07-18 ENCOUNTER — Ambulatory Visit: Payer: BC Managed Care – PPO | Admitting: Internal Medicine

## 2012-08-01 ENCOUNTER — Encounter: Payer: BC Managed Care – PPO | Admitting: Gastroenterology

## 2012-08-10 ENCOUNTER — Other Ambulatory Visit: Payer: Self-pay

## 2012-08-11 ENCOUNTER — Encounter: Payer: Self-pay | Admitting: Gastroenterology

## 2012-09-19 ENCOUNTER — Ambulatory Visit (AMBULATORY_SURGERY_CENTER): Payer: BC Managed Care – PPO | Admitting: Gastroenterology

## 2012-09-19 ENCOUNTER — Encounter: Payer: Self-pay | Admitting: Gastroenterology

## 2012-09-19 VITALS — BP 134/76 | HR 68 | Temp 97.9°F | Resp 21 | Ht 70.0 in | Wt 263.0 lb

## 2012-09-19 DIAGNOSIS — Z8500 Personal history of malignant neoplasm of unspecified digestive organ: Secondary | ICD-10-CM

## 2012-09-19 DIAGNOSIS — Z86010 Personal history of colonic polyps: Secondary | ICD-10-CM

## 2012-09-19 DIAGNOSIS — Z85038 Personal history of other malignant neoplasm of large intestine: Secondary | ICD-10-CM

## 2012-09-19 MED ORDER — SODIUM CHLORIDE 0.9 % IV SOLN: 500.0000 mL | INTRAVENOUS | Status: DC

## 2012-09-19 NOTE — Op Note (Signed)
[facility] [address] [place], [address]   COLONOSCOPY PROCEDURE REPORT  PATIENT: Dakota Abbott, Dakota Abbott  MR#: [id number] BIRTHDATE: Jul 03, 1945 , 66  yrs. old GENDER: Male ENDOSCOPIST: Louis Meckel, MD REFERRED OZ:HYQM Larena Sox, M.D. PROCEDURE DATE:  09/19/2012 PROCEDURE:   Colonoscopy, diagnostic First Screening Colonoscopy - Avg.  risk and is 50 yrs.  old or older - No.  Prior Negative Screening - Now for repeat screening. N/A  History of Adenoma - Now for follow-up colonoscopy & has been > or = to 3 yrs.  Yes hx of adenoma.  Has been 3 or more years since last colonoscopy. ASA CLASS:   Class II INDICATIONS:High risk patient with personal history of colon cancer and High risk patient with personal history of colonic polyps.(2011) MEDICATIONS: propofol (Diprivan) 300mg  IV  DESCRIPTION OF PROCEDURE:   After the risks benefits and alternatives of the procedure were thoroughly explained, informed consent was obtained.  A digital rectal exam revealed no abnormalities of the rectum.   The LB [id number]  endoscope was introduced through the anus and advanced to the cecum, which was identified by both the appendix and ileocecal valve. No adverse events experienced.   The quality of the prep was Suprep fair  The instrument was then slowly withdrawn as the colon was fully examined.      COLON FINDINGS: A normal appearing cecum, ileocecal valve, and appendiceal orifice were identified.  The ascending, hepatic flexure, transverse, splenic flexure, descending, sigmoid colon and rectum appeared unremarkable.  No polyps or cancers were seen. Retroflexed views revealed no abnormalities. The time to cecum=2 minutes 47 seconds.  Withdrawal time=7 minutes 58 seconds.  The scope was withdrawn and the procedure completed. COMPLICATIONS: There were no complications.  ENDOSCOPIC IMPRESSION: Normal colon  RECOMMENDATIONS: Colonoscopy 5 years   eSigned:  Louis Meckel, MD 09/19/2012 3:10 PM   cc: Corwin Levins, MD   PATIENT NAME:  [name], [name] MR#: [id number]

## 2012-09-19 NOTE — Progress Notes (Signed)
Patient did not experience any of the following events: a burn prior to discharge; a fall within the facility; wrong site/side/patient/procedure/implant event; or a hospital transfer or hospital admission upon discharge from the facility. (G8907) Patient did not have preoperative order for IV antibiotic SSI prophylaxis. (G8918)  

## 2012-09-19 NOTE — Patient Instructions (Signed)
Normal colon exam today! Repeat colonoscopy in 5 years. Blood sugar today was 95. Resume current medications. Call us with any questions or concerns. Thank you!!   YOU HAD AN ENDOSCOPIC PROCEDURE TODAY AT THE [facility]: Refer to the procedure report that was given to you for any specific questions about what was found during the examination.  If the procedure report does not answer your questions, please call your gastroenterologist to clarify.  If you requested that your care partner not be given the details of your procedure findings, then the procedure report has been included in a sealed envelope for you to review at your convenience later.  YOU SHOULD EXPECT: Some feelings of bloating in the abdomen. Passage of more gas than usual.  Walking can help get rid of the air that was put into your GI tract during the procedure and reduce the bloating. If you had a lower endoscopy (such as a colonoscopy or flexible sigmoidoscopy) you may notice spotting of blood in your stool or on the toilet paper. If you underwent a bowel prep for your procedure, then you may not have a normal bowel movement for a few days.  DIET: Your first meal following the procedure should be a light meal and then it is ok to progress to your normal diet.  A half-sandwich or bowl of soup is an example of a good first meal.  Heavy or fried foods are harder to digest and may make you feel nauseous or bloated.  Likewise meals heavy in dairy and vegetables can cause extra gas to form and this can also increase the bloating.  Drink plenty of fluids but you should avoid alcoholic beverages for 24 hours.  ACTIVITY: Your care partner should take you home directly after the procedure.  You should plan to take it easy, moving slowly for the rest of the day.  You can resume normal activity the day after the procedure however you should NOT DRIVE or use heavy machinery for 24 hours (because of the sedation medicines used during the  test).    SYMPTOMS TO REPORT IMMEDIATELY: A gastroenterologist can be reached at any hour.  During normal business hours, 8:30 AM to 5:00 PM Monday through Friday, call [phone].  After hours and on weekends, please call the GI answering service at [phone] who will take a message and have the physician on call contact you.   Following lower endoscopy (colonoscopy or flexible sigmoidoscopy):  Excessive amounts of blood in the stool  Significant tenderness or worsening of abdominal pains  Swelling of the abdomen that is new, acute  Fever of 100F or higher  Following upper endoscopy (EGD)  Vomiting of blood or coffee ground material  New chest pain or pain under the shoulder blades  Painful or persistently difficult swallowing  New shortness of breath  Fever of 100F or higher  Black, tarry-looking stools  FOLLOW UP: If any biopsies were taken you will be contacted by phone or by letter within the next 1-3 weeks.  Call your gastroenterologist if you have not heard about the biopsies in 3 weeks.  Our staff will call the home number listed on your records the next business day following your procedure to check on you and address any questions or concerns that you may have at that time regarding the information given to you following your procedure. This is a courtesy call and so if there is no answer at the home number and we have not heard  from you through the emergency physician on call, we will assume that you have returned to your regular daily activities without incident.  SIGNATURES/CONFIDENTIALITY: You and/or your care partner have signed paperwork which will be entered into your electronic medical record.  These signatures attest to the fact that that the information above on your After Visit Summary has been reviewed and is understood.  Full responsibility of the confidentiality of this discharge information lies with you and/or your care-partner.

## 2012-09-19 NOTE — Progress Notes (Signed)
Procedure ends, to recovery, report given and VSS. 

## 2012-09-20 ENCOUNTER — Telehealth: Payer: Self-pay

## 2012-09-20 NOTE — Telephone Encounter (Signed)
  Follow up Call-  Call back number 09/19/2012  Post procedure Call Back phone  # [phone]  Permission to leave phone message Yes     Patient questions:  Do you have a fever, pain , or abdominal swelling? no Pain Score  0 *  Have you tolerated food without any problems? yes  Have you been able to return to your normal activities? yes  Do you have any questions about your discharge instructions: Diet   no Medications  no Follow up visit  no  Do you have questions or concerns about your Care? no  Actions: * If pain score is 4 or above: No action needed, pain <4.

## 2012-10-06 ENCOUNTER — Encounter: Payer: Self-pay | Admitting: Internal Medicine

## 2012-10-06 NOTE — Telephone Encounter (Signed)
Done hardcopy to robin  

## 2012-11-05 LAB — HM COLONOSCOPY

## 2012-11-10 ENCOUNTER — Other Ambulatory Visit: Payer: Self-pay

## 2013-01-12 ENCOUNTER — Telehealth: Payer: Self-pay | Admitting: Internal Medicine

## 2013-01-12 NOTE — Telephone Encounter (Signed)
Ok with me, thanks.

## 2013-01-12 NOTE — Telephone Encounter (Signed)
Pt request to transfer from Dr. Jenny Reichmann to [facility]. Pt stated he can only come for an appt on Monday and Dr. Jenny Reichmann is now working on Monday anymore. Please advise.

## 2013-01-13 NOTE — Telephone Encounter (Signed)
Pt schedule appt for 1.26.15.

## 2013-01-13 NOTE — Telephone Encounter (Signed)
Fine with me

## 2013-01-30 ENCOUNTER — Other Ambulatory Visit: Payer: Self-pay

## 2013-01-30 ENCOUNTER — Encounter: Payer: Self-pay | Admitting: Physician Assistant

## 2013-01-30 ENCOUNTER — Other Ambulatory Visit (INDEPENDENT_AMBULATORY_CARE_PROVIDER_SITE_OTHER): Payer: BC Managed Care – PPO

## 2013-01-30 ENCOUNTER — Ambulatory Visit (INDEPENDENT_AMBULATORY_CARE_PROVIDER_SITE_OTHER): Payer: BC Managed Care – PPO | Admitting: Physician Assistant

## 2013-01-30 VITALS — BP 110/70 | HR 80 | Temp 97.8°F | Resp 20 | Ht 70.0 in | Wt 260.8 lb

## 2013-01-30 DIAGNOSIS — Z0000 Encounter for general adult medical examination without abnormal findings: Secondary | ICD-10-CM

## 2013-01-30 DIAGNOSIS — R748 Abnormal levels of other serum enzymes: Secondary | ICD-10-CM

## 2013-01-30 LAB — CBC WITH DIFFERENTIAL/PLATELET
BASOS PCT: 0.6 % (ref 0.0–3.0)
Basophils Absolute: 0 10*3/uL (ref 0.0–0.1)
Eosinophils Absolute: 0.3 10*3/uL (ref 0.0–0.7)
Eosinophils Relative: 4.4 % (ref 0.0–5.0)
HCT: 42.3 % (ref 39.0–52.0)
Hemoglobin: 14.8 g/dL (ref 13.0–17.0)
Lymphocytes Relative: 29.1 % (ref 12.0–46.0)
Lymphs Abs: 2.2 10*3/uL (ref 0.7–4.0)
MCHC: 34.9 g/dL (ref 30.0–36.0)
MCV: 93.2 fl (ref 78.0–100.0)
MONO ABS: 0.5 10*3/uL (ref 0.1–1.0)
Monocytes Relative: 6.3 % (ref 3.0–12.0)
NEUTROS PCT: 59.6 % (ref 43.0–77.0)
Neutro Abs: 4.4 10*3/uL (ref 1.4–7.7)
PLATELETS: 177 10*3/uL (ref 150.0–400.0)
RBC: 4.54 Mil/uL (ref 4.22–5.81)
RDW: 12.8 % (ref 11.5–14.6)
WBC: 7.4 10*3/uL (ref 4.5–10.5)

## 2013-01-30 LAB — URINALYSIS, ROUTINE W REFLEX MICROSCOPIC
Bilirubin Urine: NEGATIVE
Hgb urine dipstick: NEGATIVE
Ketones, ur: NEGATIVE
LEUKOCYTES UA: NEGATIVE
Nitrite: NEGATIVE
RBC / HPF: NONE SEEN (ref 0–?)
Specific Gravity, Urine: >=1.03 — AB (ref 1.000–1.030)
Total Protein, Urine: NEGATIVE
UROBILINOGEN UA: 0.2 (ref 0.0–1.0)
Urine Glucose: 100 — AB
pH: 6 (ref 5.0–8.0)

## 2013-01-30 LAB — BASIC METABOLIC PANEL
BUN: 17 mg/dL (ref 6–23)
CHLORIDE: 109 meq/L (ref 96–112)
CO2: 22 meq/L (ref 19–32)
CREATININE: 1 mg/dL (ref 0.4–1.5)
Calcium: 9.4 mg/dL (ref 8.4–10.5)
GFR: 76.48 mL/min (ref >60.00)
Glucose, Bld: 135 mg/dL — ABNORMAL HIGH (ref 70–99)
POTASSIUM: 4 meq/L (ref 3.5–5.1)
Sodium: 138 mEq/L (ref 135–145)

## 2013-01-30 LAB — LIPID PANEL
CHOL/HDL RATIO: 4
Cholesterol: 136 mg/dL (ref 0–200)
HDL: 34.2 mg/dL — ABNORMAL LOW (ref >39.00)
LDL Cholesterol: 67 mg/dL (ref 0–99)
Triglycerides: 172 mg/dL — ABNORMAL HIGH (ref 0.0–149.0)
VLDL: 34.4 mg/dL (ref 0.0–40.0)

## 2013-01-30 LAB — HEPATIC FUNCTION PANEL
ALBUMIN: 4.3 g/dL (ref 3.5–5.2)
ALT: 79 U/L — ABNORMAL HIGH (ref 0–53)
AST: 60 U/L — ABNORMAL HIGH (ref 0–37)
Alkaline Phosphatase: 43 U/L (ref 39–117)
Bilirubin, Direct: 0.1 mg/dL (ref 0.0–0.3)
Total Bilirubin: 0.8 mg/dL (ref 0.3–1.2)
Total Protein: 7.5 g/dL (ref 6.0–8.3)

## 2013-01-30 LAB — TSH: TSH: 1.04 u[IU]/mL (ref 0.35–5.50)

## 2013-01-30 MED ORDER — METFORMIN HCL ER (MOD) 1000 MG PO TB24: 1000.0000 mg | ORAL_TABLET | Freq: Every day | ORAL | Status: DC

## 2013-01-30 MED ORDER — ONE-DAILY MULTI VITAMINS PO TABS: 1.0000 | ORAL_TABLET | Freq: Every day | ORAL | Status: DC

## 2013-01-30 MED ORDER — GLIMEPIRIDE 4 MG PO TABS: ORAL_TABLET | ORAL | Status: DC

## 2013-01-30 MED ORDER — ASPIRIN 81 MG PO TBEC: 81.0000 mg | DELAYED_RELEASE_TABLET | Freq: Every day | ORAL | Status: DC

## 2013-01-30 MED ORDER — AMLODIPINE BESYLATE 5 MG PO TABS: ORAL_TABLET | ORAL | Status: DC

## 2013-01-30 MED ORDER — LOSARTAN POTASSIUM 100 MG PO TABS
100.0000 mg | ORAL_TABLET | Freq: Every day | ORAL | Status: DC
Start: 2013-01-30 — End: 2013-10-04

## 2013-01-30 NOTE — Patient Instructions (Signed)
It was great meeting you today Dakota Abbott!  Health Maintenance, Males A healthy lifestyle and preventative care can promote health and wellness.  Maintain regular health, dental, and eye exams.  Eat a healthy diet. Foods like vegetables, fruits, whole grains, low-fat dairy products, and lean protein foods contain the nutrients you need and are low in calories. Decrease your intake of foods high in solid fats, added sugars, and salt. Get information about a proper diet from your health care provider, if necessary.  Regular physical exercise is one of the most important things you can do for your health. Most adults should get at least 150 minutes of moderate-intensity exercise (any activity that increases your heart rate and causes you to sweat) each week. In addition, most adults need muscle-strengthening exercises on 2 or more days a week.   Maintain a healthy weight. The body mass index (BMI) is a screening tool to identify possible weight problems. It provides an estimate of body fat based on height and weight. Your health care provider can find your BMI and can help you achieve or maintain a healthy weight. For males 20 years and older:  A BMI below 18.5 is considered underweight.  A BMI of 18.5 to 24.9 is normal.  A BMI of 25 to 29.9 is considered overweight.  A BMI of 30 and above is considered obese.  Maintain normal blood lipids and cholesterol by exercising and minimizing your intake of saturated fat. Eat a balanced diet with plenty of fruits and vegetables. Blood tests for lipids and cholesterol should begin at age 50 and be repeated every 5 years. If your lipid or cholesterol levels are high, you are over 50, or you are at high risk for heart disease, you may need your cholesterol levels checked more frequently.Ongoing high lipid and cholesterol levels should be treated with medicines, if diet and exercise are not working.  If you smoke, find out from your health care provider how  to quit. If you do not use tobacco, do not start.  Lung cancer screening is recommended for adults aged 44 80 years who are at high risk for developing lung cancer because of a history of smoking. A yearly low-dose CT scan of the lungs is recommended for people who have at least a 30-pack-year history of smoking and are a current smoker or have quit within the past 15 years. A pack year of smoking is smoking an average of 1 pack of cigarettes a day for 1 year (for example, a 30-pack-year history of smoking could mean smoking 1 pack a day for 30 years or 2 packs a day for 15 years). Yearly screening should continue until the smoker has stopped smoking for at least 15 years. Yearly screening should be stopped for people who develop a health problem that would prevent them from having lung cancer treatment.  If you choose to drink alcohol, do not have more than 2 drinks per day. One drink is considered to be 12 oz (360 mL) of beer, 5 oz (150 mL) of wine, or 1.5 oz (45 mL) of liquor.  Avoid use of street drugs. Do not share needles with anyone. Ask for help if you need support or instructions about stopping the use of drugs.  High blood pressure causes heart disease and increases the risk of stroke. Blood pressure should be checked at least every 1 2 years. Ongoing high blood pressure should be treated with medicines if weight loss and exercise are not effective.  If  you are 14 68 years old, ask your health care provider if you should take aspirin to prevent heart disease.  Diabetes screening involves taking a blood sample to check your fasting blood sugar level. This should be done once every 3 years after age 25, if you are at a normal weight and without risk factors for diabetes. Testing should be considered at a younger age or be carried out more frequently if you are overweight and have at least 1 risk factor for diabetes.  Colorectal cancer can be detected and often prevented. Most routine colorectal  cancer screening begins at the age of 25 and continues through age 102. However, your health care provider may recommend screening at an earlier age if you have risk factors for colon cancer. On a yearly basis, your health care provider may provide home test kits to check for hidden blood in the stool. A small camera at the end of a tube may be used to directly examine the colon (sigmoidoscopy or colonoscopy) to detect the earliest forms of colorectal cancer. Talk to your health care provider about this at age 65, when routine screening begins. A direct exam of the colon should be repeated every 5 10 years through age 41, unless early forms of pre-cancerous polyps or small growths are found.  People who are at an increased risk for hepatitis B should be screened for this virus. You are considered at high risk for hepatitis B if:  You were born in a country where hepatitis B occurs often. Talk with your health care provider about which countries are considered high-risk.  Your parents were born in a high-risk country and you have not received a shot to protect against hepatitis B (hepatitis B vaccine).  You have HIV or AIDS.  You use needles to inject street drugs.  You live with, or have sex with, someone who has hepatitis B.  You are a man who has sex with other men (MSM).  You get hemodialysis treatment.  You take certain medicines for conditions like cancer, organ transplantation, and autoimmune conditions.  Hepatitis C blood testing is recommended for all people born from [birth year] through 1965 and any individual with known risk factors for hepatitis C.  Healthy men should no longer receive prostate-specific antigen (PSA) blood tests as part of routine cancer screening. Talk to your health care provider about prostate cancer screening.  Testicular cancer screening is not recommended for adolescents or adult males who have no symptoms. Screening includes self-exam, a health care provider exam,  and other screening tests. Consult with your health care provider about any symptoms you have or any concerns you have about testicular cancer.  Practice safe sex. Use condoms and avoid high-risk sexual practices to reduce the spread of sexually transmitted infections (STIs).  Use sunscreen. Apply sunscreen liberally and repeatedly throughout the day. You should seek shade when your shadow is shorter than you. Protect yourself by wearing long sleeves, pants, a wide-brimmed hat, and sunglasses year round, whenever you are outdoors.  Tell your health care provider of new moles or changes in moles, especially if there is a change in shape or color. Also tell your provider if a mole is larger than the size of a pencil eraser.  A one-time screening for abdominal aortic aneurysm (AAA) and surgical repair of large AAAs by ultrasound is recommended for men aged 99 75 years who are current or former smokers.  Stay current with your vaccines (immunizations). Document Released: 06/20/2007 Document Revised: 10/12/2012  Document Reviewed: 05/19/2010 [facility] Patient Information 2014 [place].

## 2013-01-30 NOTE — Progress Notes (Signed)
Patient ID: Dakota Abbott is a 68 y.o. male DOB: [phone] MRN: [id number]     HPI patient is a 68 year old male who presents to the clinic for annual physical exam. Patient reports doing well, diabetes controlled with Glimepiride and metformin, stating numbers at home usually around 90-105. HTN well controlled with amlodipine and losartan. Reports last colonoscopy 11/14 and flu vaccine 10/14 at [facility]. Reports he requires [facility]. Denies chest pain/palpitations, cough, wheezing, SOB, headaches, change in vision/visual disturbances, change in bowel/bladder habits, blood in stool or urine.  ROS: As stated in HPI. All other systems negative  PE: CONSTITUTIONAL: Well developed, well nourished, pleasant, appears stated age, in NAD HEENT: normocephalic, atraumatic, bilateral ext/int canals normal. Bilateral TM's without injections, bulging, erythema. Nose normal, uvula midline, oropharynx clear and moist. EYES: PERRLA, bilateral EOM and conjunctiva normal NECK: FROM, supple, without thyromegaly or mass CARDIO: RRR, normal S1 and S2, distal pulses intact. PULM/CHEST CTA bilateral, no wheezes, rales or rhonchi. Non tender. ABD: appearance normal, soft, nontender. Normal bowel sounds x 4 quadrants GU: negative for fissures, external hemorrhoids, good anal tone, no prostate enlargement noted. MUSC: FROM U/LE bilateral LYMPH: no cervical, supraclavicular adenopathy NEURO: alert and oriented x 3, no cranial nerve deficit, motor strength and coordination NL. Negative romberg. Gait normal. SKIN: warm, dry, no rash or lesions noted. PSYCH: Mood and affect normal, speech normal.  Past Medical History  Diagnosis Date  . Colon polyps     due for f/u colonoscopy march 2015; Dr Caleb Popp  . Arthritis   . Heart murmur   . Hypertension   . Colon cancer   . Type II or unspecified type diabetes mellitus without mention of complication, uncontrolled  05/05/2010  . Degenerative joint disease 05/05/2010  . Erectile dysfunction 05/05/2010  . Diabetes mellitus    Family History  Problem Relation Age of Onset  . Arthritis Mother   . Heart disease Mother   . Hypertension Mother   . Arthritis Father   . Heart disease Father   . Hypertension Father   . Diabetes Sister    History   Social History  . Marital Status: Married    Spouse Name: N/A    Number of Children: N/A  . Years of Education: 12   Occupational History  . Truck Camera operator distance   Social History Main Topics  . Smoking status: Former Research scientist (life sciences)  . Smokeless tobacco: Never Used     Comment: quit 5 yrs. ago.  . Alcohol Use: No  . Drug Use: No  . Sexual Activity: None   Other Topics Concern  . 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 Colon surgury  01/19/2006  . Hernia repair      RIGHT  . Colonoscopy     Lab Results  Component Value Date   WBC 8.0 12/07/2011   HGB 14.9 12/07/2011   HCT 43.2 12/07/2011   PLT 188.0 12/07/2011   GLUCOSE 174* 12/07/2011   CHOL 169 12/07/2011   TRIG 555.0* 12/07/2011   HDL 30.30* 12/07/2011   LDLDIRECT 85.5 12/07/2011   ALT 69* 12/07/2011   AST 51* 12/07/2011   NA 136 12/07/2011   K 4.1 12/07/2011   CL 105 12/07/2011   CREATININE 1.1 12/07/2011   BUN 19 12/07/2011   CO2 20 12/07/2011   TSH  0.48 12/07/2011   PSA 1.34 12/07/2011   HGBA1C 6.3 12/07/2011   MICROALBUR 3.7* 05/05/2010   Lab Results  Component Value Date   WBC 7.4 01/30/2013   HGB 14.8 01/30/2013   HCT 42.3 01/30/2013   PLT 177.0 01/30/2013   GLUCOSE 135* 01/30/2013   CHOL 136 01/30/2013   TRIG 172.0* 01/30/2013   HDL 34.20* 01/30/2013   LDLDIRECT 85.5 12/07/2011   LDLCALC 67 01/30/2013   ALT 79* 01/30/2013   AST 60* 01/30/2013   NA 138 01/30/2013   K 4.0 01/30/2013   CL 109 01/30/2013   CREATININE 1.0 01/30/2013   BUN 17 01/30/2013   CO2 22 01/30/2013   TSH 1.04  01/30/2013   PSA 1.34 12/07/2011   HGBA1C 6.3 12/07/2011   MICROALBUR 3.7* 05/05/2010     ASSESSMENT and PLAN   CPX/v70.0 - Patient has been counseled on age-appropriate routine health concerns for screening and prevention. These are reviewed and up-to-date. Immunizations are up-to-date or declined. Labs reviewed.  Elevated liver enzymes: Today labs resulted prior to completion of note, reviewed labs noting chronic elevation of liver enzymes, phoned patient to inform of results placed order for Hep B and C screening with abdominal ultrasound. Patient sleeping left results with patients wife.

## 2013-01-30 NOTE — Progress Notes (Signed)
Pre-visit discussion using our clinic review tool. No additional management support is needed unless otherwise documented below in the visit note.  

## 2013-01-30 NOTE — Progress Notes (Signed)
Request for Add-on lab  A1c has been faxed over.C.Zenia Resides RMA

## 2013-01-31 ENCOUNTER — Ambulatory Visit: Payer: BC Managed Care – PPO

## 2013-01-31 DIAGNOSIS — E119 Type 2 diabetes mellitus without complications: Secondary | ICD-10-CM

## 2013-01-31 LAB — HEMOGLOBIN A1C: Hgb A1c MFr Bld: 6.4 % (ref 4.6–6.5)

## 2013-02-13 ENCOUNTER — Other Ambulatory Visit: Payer: BC Managed Care – PPO

## 2013-02-13 DIAGNOSIS — R748 Abnormal levels of other serum enzymes: Secondary | ICD-10-CM

## 2013-02-14 ENCOUNTER — Encounter: Payer: Self-pay | Admitting: Physician Assistant

## 2013-02-14 LAB — HEPATITIS B SURFACE ANTIGEN: Hepatitis B Surface Ag: NEGATIVE

## 2013-02-14 LAB — HEPATITIS B SURFACE ANTIBODY,QUALITATIVE: HEP B S AB: NEGATIVE

## 2013-02-14 LAB — HEPATITIS C ANTIBODY: HCV Ab: NEGATIVE

## 2013-02-14 LAB — HEPATITIS B CORE ANTIBODY, TOTAL: Hep B Core Total Ab: NONREACTIVE

## 2013-02-19 ENCOUNTER — Encounter: Payer: Self-pay | Admitting: Oncology

## 2013-02-19 NOTE — Progress Notes (Unsigned)
The 2008 Colon tumor returned microsatellite stable, with no loss of mismatch repair protein expression.

## 2013-02-23 ENCOUNTER — Ambulatory Visit
Admission: RE | Admit: 2013-02-23 | Discharge: 2013-02-23 | Disposition: A | Discharge status: Home / Self Care | Payer: BC Managed Care – PPO | Source: Ambulatory Visit | Attending: Physician Assistant | Admitting: Physician Assistant

## 2013-02-23 DIAGNOSIS — R748 Abnormal levels of other serum enzymes: Secondary | ICD-10-CM

## 2013-02-23 IMAGING — US US ABDOMEN COMPLETE
1 series · 13 of 25 positions shown · non-contrast
Comparison: None.

CLINICAL DATA: Elevated LFTs

EXAM:
ULTRASOUND ABDOMEN COMPLETE

[Series 1: us abdomen complete · 0.50mm/px · 13 of 85 slices shown]
[im 1/85]
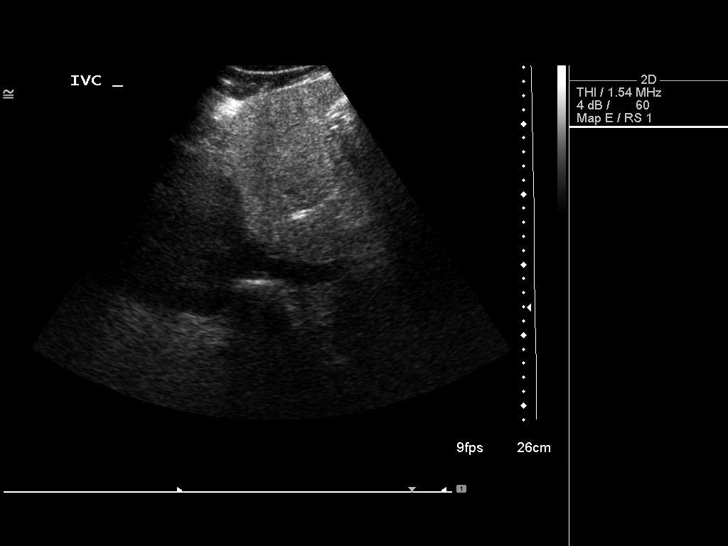
[im 8/85]
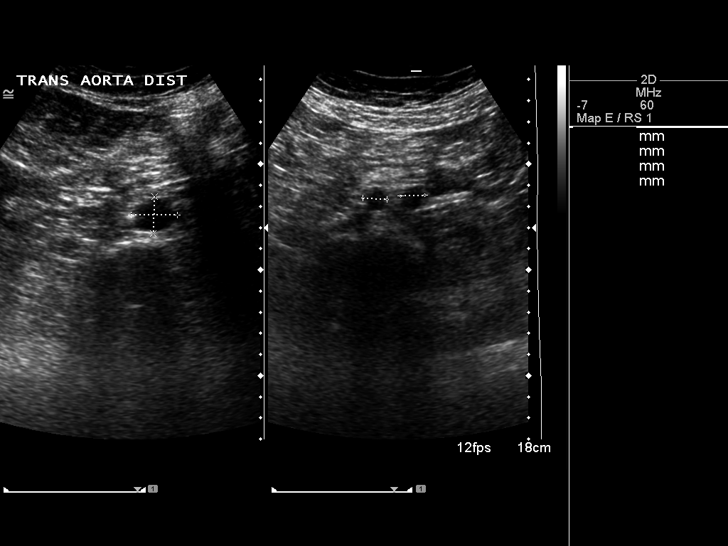
[im 15/85]
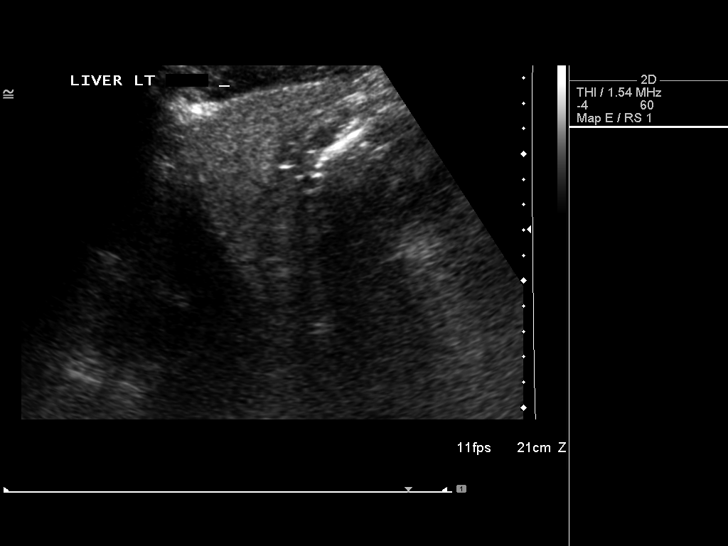
[im 22/85]
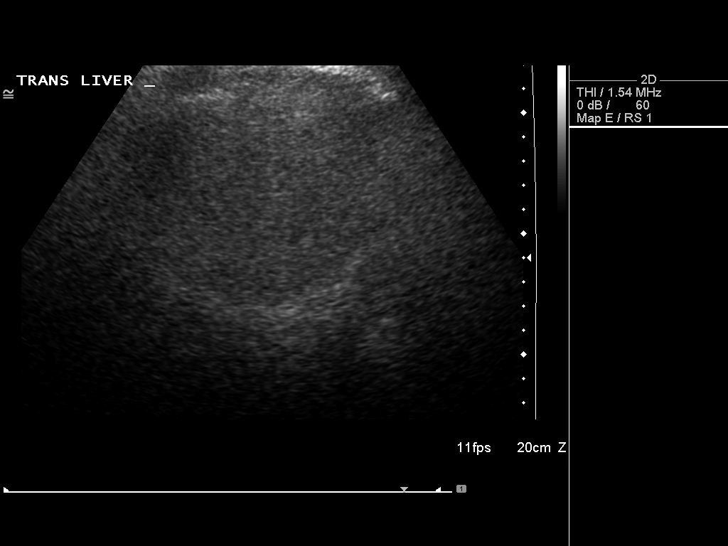
[im 29/85]
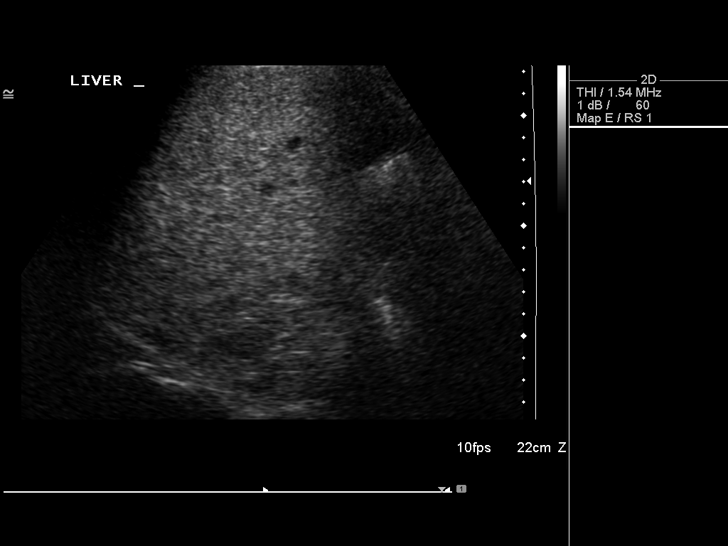
[im 36/85]
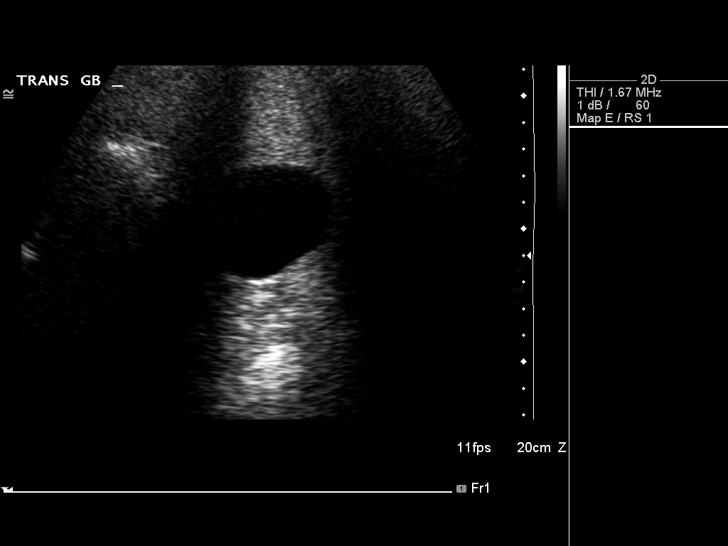
[im 43/85]
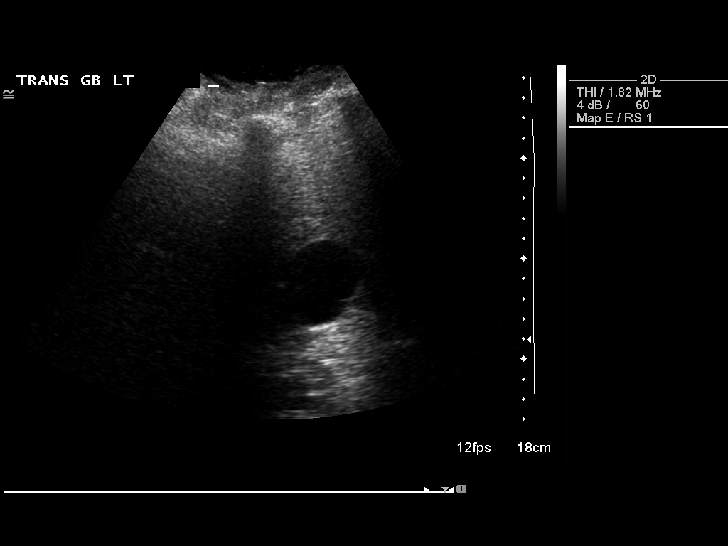
[im 50/85]
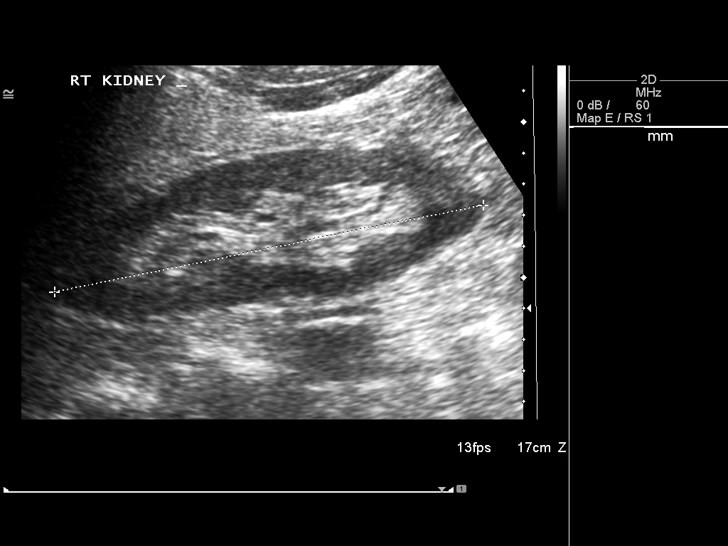
[im 57/85]
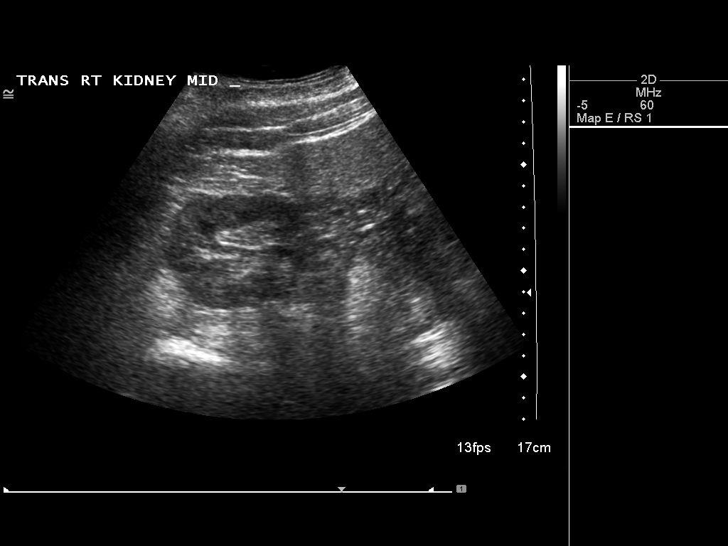
[im 64/85]
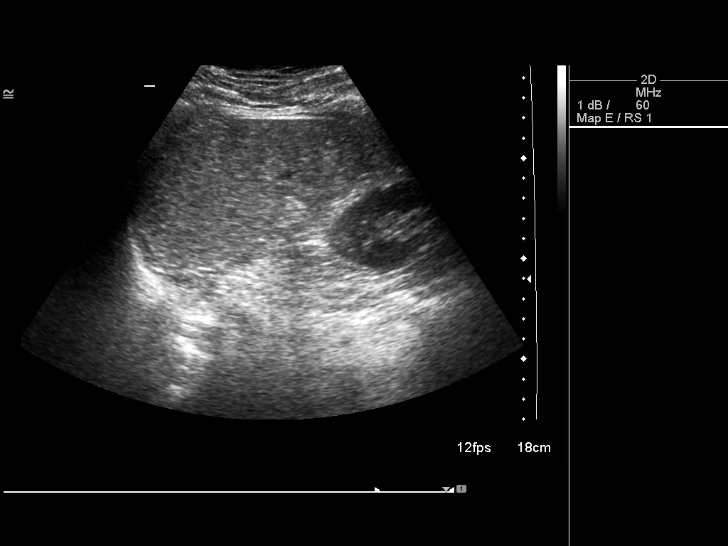
[im 71/85]
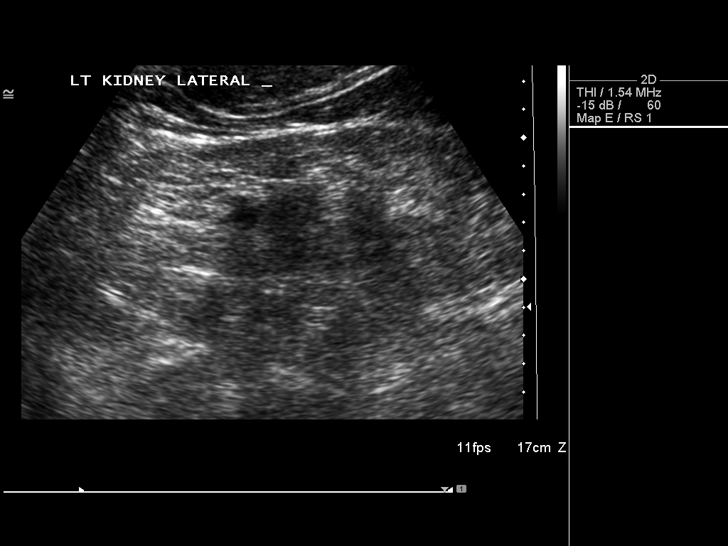
[im 78/85]
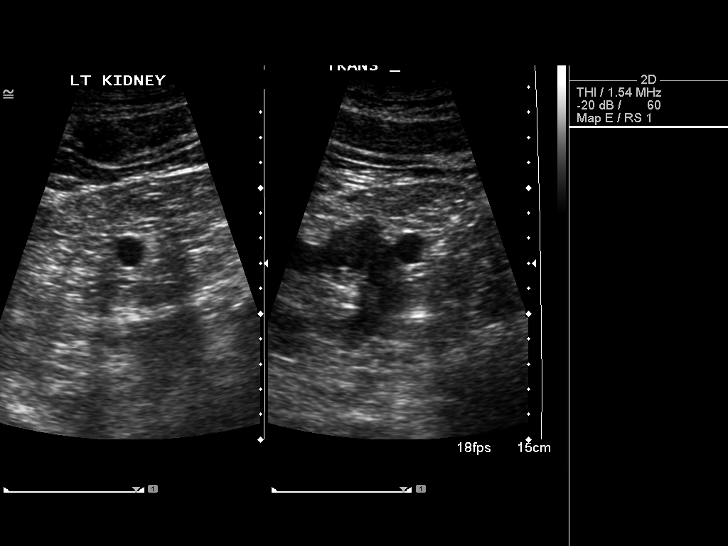
[im 85/85]
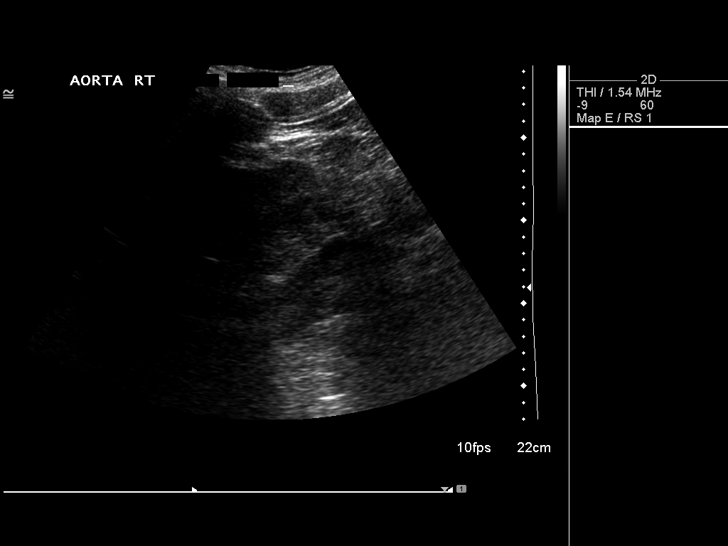

[13 of 25 positions shown; findings below may reference images not displayed]

FINDINGS: Gallbladder:

No gallstones or wall thickening visualized. No sonographic Murphy
sign noted.

Common bile duct:

Diameter: Within normal limits at 4.7 mm

Liver:

Heterogeneously increased echogenicity of the hepatic parenchyma.
The adjacent kidney appears relatively hypoechoic in comparison.
Main portal vein is patent with normal hepatopetal flow.

IVC:

No abnormality visualized.

Pancreas:

Partially obscured by overlying bowel gas.

Spleen:

Mild splenomegaly. The spleen measures 14.7 x 13.5 x 5.3 cm for a
total volume of 540 cubic cm.

Right Kidney:

Length: 14.1 cm. Echogenicity within normal limits. No solid mass or
hydronephrosis visualized. 8 x 9 x 8 mm simple cyst exophytic from
the interpolar region.

Left Kidney:

Length: 13.4 cm. Echogenicity within normal limits. No solid mass or
hydronephrosis visualized. 1.2 x 1.3 x 1.1 cm simple cyst exophytic
from the upper pole.

Abdominal aorta:

Ectatic abdominal aorta measuring up to 3 cm proximally and 2.8 cm
in the mid segment.

Other findings:

None.
IMPRESSION: 1. Heterogeneous and echogenic appearance of the liver parenchyma as
can be seen with hepatic steatosis and intrinsic liver disease.
Hepatic steatosis is favored.
2. Borderline to mild splenomegaly.
3. Ectatic abdominal aorta measuring up to 2.8 cm in the mid
segment. Ectatic abdominal aorta at risk for aneurysm development.
Recommend follow up by US in 5 years. This recommendation follows
ACR consensus guidelines: White Paper of the ACR Incidental Findings
Committee II on Vascular Findings. [HOSPITAL] [CB];

## 2013-02-27 ENCOUNTER — Other Ambulatory Visit: Payer: BC Managed Care – PPO

## 2013-05-15 ENCOUNTER — Encounter (HOSPITAL_COMMUNITY): Payer: Self-pay | Admitting: Emergency Medicine

## 2013-05-15 ENCOUNTER — Emergency Department (INDEPENDENT_AMBULATORY_CARE_PROVIDER_SITE_OTHER): Payer: BC Managed Care – PPO

## 2013-05-15 ENCOUNTER — Emergency Department (INDEPENDENT_AMBULATORY_CARE_PROVIDER_SITE_OTHER)
Admission: EM | Admit: 2013-05-15 | Discharge: 2013-05-15 | Disposition: A | Discharge status: Home / Self Care | Payer: BC Managed Care – PPO | Source: Home / Self Care | Attending: Family Medicine | Admitting: Family Medicine

## 2013-05-15 DIAGNOSIS — J019 Acute sinusitis, unspecified: Secondary | ICD-10-CM

## 2013-05-15 MED ORDER — AMOXICILLIN-POT CLAVULANATE 875-125 MG PO TABS: 1.0000 | ORAL_TABLET | Freq: Two times a day (BID) | ORAL | Status: DC

## 2013-05-15 MED ORDER — FLUTICASONE PROPIONATE 50 MCG/ACT NA SUSP: 2.0000 | Freq: Two times a day (BID) | NASAL | Status: DC

## 2013-05-15 NOTE — Discharge Instructions (Signed)

## 2013-05-15 NOTE — ED Provider Notes (Signed)
CSN: [id number]     Arrival date & time 05/15/13  0813 History   First MD Initiated Contact with Patient 05/15/13 0830     Chief Complaint  Patient presents with  . URI   (Consider location/radiation/quality/duration/timing/severity/associated sxs/prior Treatment) HPI Comments: 68 year old male presents complaining of "bad case of flu that my flu shot didn't treat."  He has headache, sinus pressure, nasal congestion, cough, fatigue, loss of appetite, shortness of breath on exertion, chest tightness. His symptoms began one week ago with a sore throat the sore throat has partially resolved but the other symptoms continued to worsen. He has taken over-the-counter medication with mild relief of his symptoms but he feels that overall he is still getting worse. He denies any fever, NVD. Cough is nonproductive. No recent travel or sick contacts.  Patient is a 68 y.o. male presenting with URI.  URI Presenting symptoms: congestion, cough, fatigue, rhinorrhea and sore throat   Presenting symptoms: no ear pain and no fever     Past Medical History  Diagnosis Date  . Colon polyps     due for f/u colonoscopy march 2015; Dr Caleb Popp  . Arthritis   . Heart murmur   . Hypertension   . Colon cancer   . Type II or unspecified type diabetes mellitus without mention of complication, uncontrolled 05/05/2010  . Degenerative joint disease 05/05/2010  . Erectile dysfunction 05/05/2010  . Diabetes mellitus    Past Surgical History  Procedure Laterality Date  . Colon surgury  01/19/2006  . Hernia repair      RIGHT  . Colonoscopy     Family History  Problem Relation Age of Onset  . Arthritis Mother   . Heart disease Mother   . Hypertension Mother   . Arthritis Father   . Heart disease Father   . Hypertension Father   . Diabetes Sister    History  Substance Use Topics  . Smoking status: Former Research scientist (life sciences)  . Smokeless tobacco: Never Used     Comment: quit 5 yrs. ago.  . Alcohol Use: No    Review  of Systems  Constitutional: Positive for appetite change and fatigue. Negative for fever and chills.  HENT: Positive for congestion, postnasal drip, rhinorrhea, sinus pressure and sore throat. Negative for ear pain.   Eyes: Positive for discharge.  Respiratory: Positive for cough, chest tightness and shortness of breath.   Cardiovascular: Negative for chest pain and leg swelling.  Gastrointestinal: Negative for nausea, vomiting, abdominal pain and diarrhea.  Genitourinary: Negative for flank pain.  Musculoskeletal: Negative for back pain.  Skin: Negative for rash.  All other systems reviewed and are negative.   Allergies  Lisinopril  Home Medications   Prior to Admission medications   Medication Sig Start Date End Date Taking? Authorizing Provider  amLODipine (NORVASC) 5 MG tablet TAKE 1 TABLET BY MOUTH DAILY 01/30/13  Yes Stacy Gardner, PA-C  aspirin (RA ASPIRIN EC ADULT LOW ST) 81 MG EC tablet Take 1 tablet (81 mg total) by mouth daily. Swallow whole. 01/30/13  Yes Stacy Gardner, PA-C  glimepiride (AMARYL) 4 MG tablet TAKE 1 TABLET BY MOUTH DAILY 01/30/13  Yes Stacy Gardner, PA-C  losartan (COZAAR) 100 MG tablet Take 1 tablet (100 mg total) by mouth daily. 01/30/13  Yes Stacy Gardner, PA-C  metFORMIN (GLUMETZA) 1000 MG (MOD) 24 hr tablet Take 1 tablet (1,000 mg total) by mouth daily with breakfast. 01/30/13  Yes Stacy Gardner, PA-C  Multiple Vitamin (MULTIVITAMIN) tablet Take 1 tablet by mouth  daily. 01/30/13  Yes Stacy Gardner, PA-C  phentermine 37.5 MG capsule Take 1 capsule (37.5 mg total) by mouth every morning. 12/07/11  Yes Biagio Borg, MD  amoxicillin (AMOXIL) 500 MG capsule TAKE 2 CAPSULES BY MOUTH TWICE DAILY 01/25/12   Biagio Borg, MD   BP 159/86  Pulse 95  Temp(Src) 98.4 F (36.9 C) (Oral)  Resp 16  SpO2 98% Physical Exam  Nursing note and vitals reviewed. Constitutional: He is oriented to person, place, and time. He appears well-developed and well-nourished. No distress.   HENT:  Head: Normocephalic and atraumatic.  Nose: Mucosal edema present. Right sinus exhibits no maxillary sinus tenderness and no frontal sinus tenderness. Left sinus exhibits no maxillary sinus tenderness and no frontal sinus tenderness.  Mouth/Throat: Uvula is midline and mucous membranes are normal.  Thick postnasal drainage in the posterior oropharynx  Eyes: Conjunctivae are normal. Right eye exhibits no discharge. Left eye exhibits no discharge. No scleral icterus.  Neck: Normal range of motion. Neck supple.  Cardiovascular: Normal rate and regular rhythm.   Murmur heard.  Systolic murmur is present with a grade of 3/6  Pulmonary/Chest: Effort normal and breath sounds normal. No respiratory distress.  Lymphadenopathy:       Head (right side): No submandibular, no tonsillar, no preauricular and no posterior auricular adenopathy present.       Head (left side): No submandibular, no tonsillar, no preauricular and no posterior auricular adenopathy present.    He has no cervical adenopathy.  Neurological: He is alert and oriented to person, place, and time. Coordination normal.  Skin: Skin is warm and dry. No rash noted. He is not diaphoretic.  Psychiatric: He has a normal mood and affect. Judgment normal.    ED Course  Procedures (including critical care time) Labs Review Labs Reviewed - No data to display  Imaging Review Dg Chest 2 View  05/15/2013   CLINICAL DATA:  Shortness of breath.  EXAM: CHEST  2 VIEW  COMPARISON:  None.  FINDINGS: The heart size and mediastinal contours are within normal limits. Both lungs are clear. No pneumothorax or pleural effusion is noted. The visualized skeletal structures are unremarkable.  IMPRESSION: No acute cardiopulmonary abnormality seen.   Electronically Signed   By: Sabino Dick M.D.   On: 05/15/2013 09:05     MDM   1. Sinusitis, acute    Treating for sinusitis there may also use albuterol nebs that they have at home for any chest  tightness from coughing. Followup if no improvement in 3-4 days.   Meds ordered this encounter  Medications  . amoxicillin-clavulanate (AUGMENTIN) 875-125 MG per tablet    Sig: Take 1 tablet by mouth every 12 (twelve) hours.    Dispense:  14 tablet    Refill:  0    Order Specific Question:  Supervising Provider    Answer:  Lynne Leader, [place]  . fluticasone (FLONASE) 50 MCG/ACT nasal spray    Sig: Place 2 sprays into both nostrils 2 (two) times daily. Decrease to 2 sprays/nostril daily after 5 days    Dispense:  16 g    Refill:  2    Order Specific Question:  Supervising Provider    Answer:  Lynne Leader, [place]       Liam Graham, PA-C 05/15/13 [phone]

## 2013-05-15 NOTE — ED Notes (Signed)
C/o  Sinus pressure and pain.  Nasal/heard congestion.  Loss of appetite. Chest pressure/tightness.   Sob with exertion.   X  5 days.   Denies fever, n/v/d.   No relief with otc meds

## 2013-05-18 NOTE — ED Provider Notes (Signed)
Medical screening examination/treatment/procedure(s) were performed by a resident physician or non-physician practitioner and as the supervising physician I was immediately available for consultation/collaboration.  Lynne Leader, MD    Gregor Hams, MD 05/18/13 [phone]

## 2013-07-03 ENCOUNTER — Encounter: Payer: Self-pay | Admitting: Internal Medicine

## 2013-07-03 DIAGNOSIS — Z0000 Encounter for general adult medical examination without abnormal findings: Secondary | ICD-10-CM

## 2013-07-03 MED ORDER — AMLODIPINE BESYLATE 5 MG PO TABS: ORAL_TABLET | ORAL | Status: DC

## 2013-09-07 ENCOUNTER — Other Ambulatory Visit: Payer: Self-pay | Admitting: Physician Assistant

## 2013-09-16 ENCOUNTER — Telehealth: Payer: Self-pay

## 2013-09-16 NOTE — Telephone Encounter (Signed)
Spoke to pt and pt stated that bp has been running 130/70 since the last office and that the changes made have been working.

## 2013-09-29 ENCOUNTER — Encounter: Payer: Self-pay | Admitting: Internal Medicine

## 2013-09-29 ENCOUNTER — Other Ambulatory Visit: Payer: Self-pay | Admitting: Internal Medicine

## 2013-09-29 MED ORDER — METFORMIN HCL ER (MOD) 1000 MG PO TB24: 1000.0000 mg | ORAL_TABLET | Freq: Every day | ORAL | Status: DC

## 2013-10-02 ENCOUNTER — Other Ambulatory Visit: Payer: Self-pay | Admitting: *Deleted

## 2013-10-02 ENCOUNTER — Encounter: Payer: Self-pay | Admitting: Internal Medicine

## 2013-10-02 MED ORDER — METFORMIN HCL ER (MOD) 1000 MG PO TB24: 1000.0000 mg | ORAL_TABLET | Freq: Every day | ORAL | Status: DC

## 2013-10-02 NOTE — Telephone Encounter (Signed)
Sent email  Needing month supply sent to local pharmacy on his metformin until he receive his mail service.Marland KitchenJohny Abbott

## 2013-10-04 ENCOUNTER — Other Ambulatory Visit: Payer: Self-pay

## 2013-10-04 DIAGNOSIS — Z0000 Encounter for general adult medical examination without abnormal findings: Secondary | ICD-10-CM

## 2013-10-04 MED ORDER — LOSARTAN POTASSIUM 100 MG PO TABS: 100.0000 mg | ORAL_TABLET | Freq: Every day | ORAL | Status: DC

## 2013-10-15 ENCOUNTER — Encounter: Payer: Self-pay | Admitting: Internal Medicine

## 2013-10-16 ENCOUNTER — Other Ambulatory Visit: Payer: Self-pay

## 2013-10-16 MED ORDER — METFORMIN HCL ER 500 MG PO TB24: ORAL_TABLET | ORAL | Status: DC

## 2013-11-01 ENCOUNTER — Other Ambulatory Visit: Payer: Self-pay | Admitting: Physician Assistant

## 2013-12-17 ENCOUNTER — Encounter: Payer: Self-pay | Admitting: Internal Medicine

## 2013-12-17 DIAGNOSIS — Z0000 Encounter for general adult medical examination without abnormal findings: Secondary | ICD-10-CM

## 2013-12-18 MED ORDER — GLIMEPIRIDE 4 MG PO TABS: ORAL_TABLET | ORAL | Status: DC

## 2014-02-05 ENCOUNTER — Encounter: Payer: BC Managed Care – PPO | Admitting: Internal Medicine

## 2014-02-12 ENCOUNTER — Encounter: Payer: Self-pay | Admitting: Internal Medicine

## 2014-03-04 ENCOUNTER — Encounter: Payer: Self-pay | Admitting: Internal Medicine

## 2014-03-04 DIAGNOSIS — Z0000 Encounter for general adult medical examination without abnormal findings: Secondary | ICD-10-CM

## 2014-03-05 MED ORDER — AMLODIPINE BESYLATE 5 MG PO TABS: ORAL_TABLET | ORAL | Status: DC

## 2014-03-07 ENCOUNTER — Other Ambulatory Visit: Payer: Self-pay | Admitting: Physician Assistant

## 2014-03-20 ENCOUNTER — Encounter: Payer: Self-pay | Admitting: Internal Medicine

## 2014-03-20 ENCOUNTER — Telehealth: Payer: Self-pay

## 2014-03-20 DIAGNOSIS — Z0000 Encounter for general adult medical examination without abnormal findings: Secondary | ICD-10-CM

## 2014-03-20 MED ORDER — ONE-DAILY MULTI VITAMINS PO TABS: 1.0000 | ORAL_TABLET | Freq: Every day | ORAL | Status: DC

## 2014-03-20 MED ORDER — ASPIRIN 81 MG PO TBEC: 81.0000 mg | DELAYED_RELEASE_TABLET | Freq: Every day | ORAL | Status: DC

## 2014-03-20 NOTE — Telephone Encounter (Signed)
Rx approved and pt notified.

## 2014-03-20 NOTE — Telephone Encounter (Signed)
Received refill request from pharmacy for patient (former Dakota Abbott pt). Refills were previously temporarily filled by Dr. Ronnald Ramp with the intent that pt would keep appointment to establish. Pt last seen 01/2013 by Dakota Abbott and has canceled two appointment to see Dr. Ronnald Ramp. Any future refills denied, pt need to establish a PCP.

## 2014-04-27 ENCOUNTER — Other Ambulatory Visit: Payer: Self-pay | Admitting: Internal Medicine

## 2014-05-14 ENCOUNTER — Encounter: Payer: Self-pay | Admitting: Internal Medicine

## 2014-05-18 ENCOUNTER — Ambulatory Visit (INDEPENDENT_AMBULATORY_CARE_PROVIDER_SITE_OTHER): Payer: PRIVATE HEALTH INSURANCE | Admitting: Internal Medicine

## 2014-05-18 ENCOUNTER — Encounter: Payer: Self-pay | Admitting: Internal Medicine

## 2014-05-18 ENCOUNTER — Other Ambulatory Visit (INDEPENDENT_AMBULATORY_CARE_PROVIDER_SITE_OTHER): Payer: PRIVATE HEALTH INSURANCE

## 2014-05-18 VITALS — BP 120/58 | HR 75 | Temp 98.3°F | Resp 16 | Ht 70.0 in | Wt 258.0 lb

## 2014-05-18 DIAGNOSIS — E119 Type 2 diabetes mellitus without complications: Secondary | ICD-10-CM

## 2014-05-18 DIAGNOSIS — Z0000 Encounter for general adult medical examination without abnormal findings: Secondary | ICD-10-CM

## 2014-05-18 DIAGNOSIS — R7989 Other specified abnormal findings of blood chemistry: Secondary | ICD-10-CM

## 2014-05-18 DIAGNOSIS — I10 Essential (primary) hypertension: Secondary | ICD-10-CM

## 2014-05-18 DIAGNOSIS — Z23 Encounter for immunization: Secondary | ICD-10-CM | POA: Diagnosis not present

## 2014-05-18 DIAGNOSIS — M1712 Unilateral primary osteoarthritis, left knee: Secondary | ICD-10-CM

## 2014-05-18 DIAGNOSIS — R21 Rash and other nonspecific skin eruption: Secondary | ICD-10-CM | POA: Insufficient documentation

## 2014-05-18 DIAGNOSIS — E669 Obesity, unspecified: Secondary | ICD-10-CM

## 2014-05-18 LAB — COMPREHENSIVE METABOLIC PANEL
ALBUMIN: 4.2 g/dL (ref 3.5–5.2)
ALK PHOS: 35 U/L — AB (ref 39–117)
ALT: 72 U/L — AB (ref 0–53)
AST: 51 U/L — AB (ref 0–37)
BUN: 16 mg/dL (ref 6–23)
CALCIUM: 9.4 mg/dL (ref 8.4–10.5)
CO2: 21 mEq/L (ref 19–32)
Chloride: 105 mEq/L (ref 96–112)
Creatinine, Ser: 1.1 mg/dL (ref 0.40–1.50)
GFR: 70.62 mL/min (ref >60.00)
Glucose, Bld: 131 mg/dL — ABNORMAL HIGH (ref 70–99)
Potassium: 4.3 mEq/L (ref 3.5–5.1)
SODIUM: 134 meq/L — AB (ref 135–145)
Total Bilirubin: 0.6 mg/dL (ref 0.2–1.2)
Total Protein: 7.4 g/dL (ref 6.0–8.3)

## 2014-05-18 LAB — LIPID PANEL
Cholesterol: 135 mg/dL (ref 0–200)
HDL: 34.8 mg/dL — AB (ref >39.00)
NonHDL: 100.2
Total CHOL/HDL Ratio: 4
Triglycerides: 235 mg/dL — ABNORMAL HIGH (ref 0.0–149.0)
VLDL: 47 mg/dL — ABNORMAL HIGH (ref 0.0–40.0)

## 2014-05-18 LAB — LDL CHOLESTEROL, DIRECT: Direct LDL: 67 mg/dL

## 2014-05-18 LAB — HEMOGLOBIN A1C: Hgb A1c MFr Bld: 6.4 % (ref 4.6–6.5)

## 2014-05-18 MED ORDER — TRIAMCINOLONE ACETONIDE 0.1 % EX CREA: 1.0000 "application " | TOPICAL_CREAM | Freq: Two times a day (BID) | CUTANEOUS | Status: DC

## 2014-05-18 MED ORDER — MELOXICAM 15 MG PO TABS: 15.0000 mg | ORAL_TABLET | Freq: Every day | ORAL | Status: DC

## 2014-05-18 NOTE — Patient Instructions (Signed)
We have sent in a cream for the rash on the foot called kenalog which you put on twice a day. It should work within 2 weeks. If it does not start to help call us back.   We have sent in the medicine for the arthritis called mobic. It is a medicine that you take once a day for pain that works especially well with arthritis.   We will check on the blood work and call you back with the results. We have given you the pneumonia shot today.

## 2014-05-18 NOTE — Assessment & Plan Note (Signed)
Checking HgA1c today, currently taking glipizide and metformin. Checking CMP, lipid panel, foot exam done. Reminded him about eye exam yearly. He is trying to exercise and talked to him about weight. Denies low sugars and morning average is 100-110. Not on statin but taking aspirin daily.

## 2014-05-18 NOTE — Assessment & Plan Note (Signed)
Talked to him about the need to lose weight and he will work on it. 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

## 2014-05-18 NOTE — Progress Notes (Signed)
   Subjective:    Patient ID: Dakota Abbott, male    DOB: 06-Mar-1945, 69 y.o.   MRN: [id number]  HPI The patient is a 69 YO man who is coming in for wellness. He has a rash on his foot which has been present for 6 weeks. No pain or spreading. No exposure to outdoors or allergen. Please see A/P for status and treatment of chronic medical problems.   PMH, [facility], social history reviewed and updated with the patient.   Review of Systems  Constitutional: Negative for fever, activity change, appetite change, fatigue and unexpected weight change.  HENT: Negative.   Eyes: Negative.   Respiratory: Negative for cough, chest tightness, shortness of breath and wheezing.   Cardiovascular: Negative for chest pain, palpitations and leg swelling.  Gastrointestinal: Positive for diarrhea. Negative for nausea, abdominal pain, constipation and abdominal distention.       Occasional since colon surgery years ago  Musculoskeletal: Positive for arthralgias. Negative for myalgias, back pain and gait problem.  Skin: Positive for rash. Negative for color change, pallor and wound.  Neurological: Negative.   Psychiatric/Behavioral: Negative.       Objective:   Physical Exam  Constitutional: He is oriented to person, place, and time. He appears well-developed and well-nourished.  Overweight  HENT:  Head: Normocephalic and atraumatic.  Eyes: EOM are normal.  Neck: Normal range of motion.  Cardiovascular: Normal rate and regular rhythm.   Pulmonary/Chest: Effort normal and breath sounds normal. No respiratory distress. He has no wheezes. He has no rales.  Abdominal: Soft. He exhibits no distension. There is no tenderness. There is no rebound.  Musculoskeletal: He exhibits no edema.  Neurological: He is alert and oriented to person, place, and time. Coordination normal.  Skin: Skin is warm and dry. Rash noted.  2 circular rashes on the top of the left foot, not tender to touch and no rolled edges, no central  clearing  Psychiatric: He has a normal mood and affect.   Filed Vitals:   05/18/14 0845  BP: 120/58  Pulse: 75  Temp: 98.3 F (36.8 C)  TempSrc: Oral  Resp: 16  Height: 5\' 10"  (1.778 m)  Weight: 258 lb (117.028 kg)  SpO2: 96%      Assessment & Plan:  Pneumonia 23 shot given at visit

## 2014-05-18 NOTE — Assessment & Plan Note (Signed)
BP well controlled on losartan monotherapy. Checking labs today.

## 2014-05-18 NOTE — Assessment & Plan Note (Signed)
Trial of mobic for the knee pain.

## 2014-05-18 NOTE — Progress Notes (Signed)
Pre visit review using our clinic review tool, if applicable. No additional management support is needed unless otherwise documented below in the visit note. 

## 2014-05-18 NOTE — Addendum Note (Signed)
Addended by: Resa Miner R on: 05/18/2014 11:38 AM   Modules accepted: Orders

## 2014-05-18 NOTE — Assessment & Plan Note (Signed)
Checking lipid panel, due for colonoscopy in 2019. Advised to exercise daily and to work on decreasing his weight to improve his overall health.

## 2014-05-18 NOTE — Assessment & Plan Note (Signed)
Triamcinolone cream for his rash and if no improvement will refer to dermatology.

## 2014-05-30 ENCOUNTER — Encounter: Payer: Self-pay | Admitting: Internal Medicine

## 2014-05-30 DIAGNOSIS — I872 Venous insufficiency (chronic) (peripheral): Secondary | ICD-10-CM

## 2014-06-28 ENCOUNTER — Other Ambulatory Visit: Payer: Self-pay | Admitting: Internal Medicine

## 2014-06-29 ENCOUNTER — Other Ambulatory Visit: Payer: Self-pay | Admitting: Internal Medicine

## 2014-07-15 ENCOUNTER — Other Ambulatory Visit: Payer: Self-pay | Admitting: Internal Medicine

## 2014-07-16 ENCOUNTER — Other Ambulatory Visit: Payer: Self-pay

## 2014-07-16 MED ORDER — TRIAMCINOLONE ACETONIDE 0.1 % EX CREA: TOPICAL_CREAM | CUTANEOUS | Status: DC

## 2014-07-17 ENCOUNTER — Other Ambulatory Visit: Payer: Self-pay

## 2014-07-17 DIAGNOSIS — Z0000 Encounter for general adult medical examination without abnormal findings: Secondary | ICD-10-CM

## 2014-07-17 MED ORDER — AMLODIPINE BESYLATE 5 MG PO TABS: ORAL_TABLET | ORAL | Status: DC

## 2014-08-23 ENCOUNTER — Other Ambulatory Visit: Payer: Self-pay | Admitting: Internal Medicine

## 2014-09-09 ENCOUNTER — Telehealth: Payer: Self-pay | Admitting: Family

## 2014-09-09 DIAGNOSIS — J069 Acute upper respiratory infection, unspecified: Secondary | ICD-10-CM

## 2014-09-09 MED ORDER — AMOXICILLIN-POT CLAVULANATE 875-125 MG PO TABS: 1.0000 | ORAL_TABLET | Freq: Two times a day (BID) | ORAL | Status: DC

## 2014-09-09 MED ORDER — BENZONATATE 100 MG PO CAPS: 100.0000 mg | ORAL_CAPSULE | Freq: Three times a day (TID) | ORAL | Status: DC | PRN

## 2014-09-09 NOTE — Progress Notes (Signed)
We are sorry that you are not feeling well.  Here is how we plan to help!  Based on what you have shared with me it looks like you have upper respiratory tract inflammation that has resulted in a significant cough.  Inflammation and infection in the upper respiratory tract is commonly called bronchitis and has four common causes:  Allergies, Viral Infections, Acid Reflux and Bacterial Infections.  Allergies, viruses and acid reflux are treated by controlling symptoms or eliminating the cause. An example might be a cough caused by taking certain blood pressure medications. You stop the cough by changing the medication. Another example might be a cough caused by acid reflux. Controlling the reflux helps control the cough.  Based on your presentation I believe you most likely have A cough due to bacteria.  When patients have a fever and a productive cough with a change in color or increased sputum production, we are concerned about bacterial bronchitis.  If left untreated it can progress to pneumonia.  If your symptoms do not improve with your treatment plan it is important that you contact your provider.   I have prescribed Augmentin, an antibiotic in the penicillin family, one tablet twice daily with food, for 7 days.  In addition you may use A non-prescription cough medication called Robitussin DAC. Take 2 teaspoons every 8 hours or Delsym: take 2 teaspoons every 12 hours., A non-prescription cough medication called Mucinex DM: take 2 tablets every 12 hours. and A prescription cough medication called Tessalon Perles 100mg . You may take 1-2 capsules every 8 hours as needed for your cough.    HOME CARE . Only take medications as instructed by your medical team. . Complete the entire course of an antibiotic. . Drink plenty of fluids and get plenty of rest. . Avoid close contacts especially the very young and the elderly . Cover your mouth if you cough or cough into your sleeve. . Always remember to wash  your hands . A steam or ultrasonic humidifier can help congestion.    GET HELP RIGHT AWAY IF: . You develop worsening fever. . You become short of breath . You cough up blood. . Your symptoms persist after you have completed your treatment plan MAKE SURE YOU  1. Understand these instructions. 2. Will watch your condition. 3. Will get help right away if you are not doing well or get worse.  Your e-visit answers were reviewed by a board certified advanced clinical practitioner to complete your personal care plan.  Depending on the condition, your plan could have included both over the counter or prescription medications. If there is a problem please reply  once you have received a response from your provider. Your safety is important to [place].  If you have drug allergies check your prescription carefully.    You can use MyChart to ask questions about today's visit, request a non-urgent call back, or ask for a work or school excuse for 24 hours related to this e-Visit. If it has been greater than 24 hours you will need to follow up with your provider, or enter a new e-Visit to address those concerns. You will get an e-mail in the next two days asking about your experience.  I hope that your e-visit has been valuable and will speed your recovery. Thank you for using e-visits.

## 2014-09-17 ENCOUNTER — Other Ambulatory Visit: Payer: Self-pay | Admitting: Internal Medicine

## 2014-10-29 ENCOUNTER — Other Ambulatory Visit: Payer: Self-pay | Admitting: Internal Medicine

## 2014-12-10 ENCOUNTER — Ambulatory Visit: Payer: PRIVATE HEALTH INSURANCE | Admitting: Internal Medicine

## 2015-03-02 ENCOUNTER — Other Ambulatory Visit: Payer: Self-pay | Admitting: Internal Medicine

## 2015-04-17 ENCOUNTER — Other Ambulatory Visit: Payer: Self-pay

## 2015-04-17 ENCOUNTER — Telehealth: Payer: Self-pay

## 2015-04-17 DIAGNOSIS — Z0000 Encounter for general adult medical examination without abnormal findings: Secondary | ICD-10-CM

## 2015-04-17 MED ORDER — ASPIRIN 81 MG PO TBEC: 81.0000 mg | DELAYED_RELEASE_TABLET | Freq: Every day | ORAL | Status: DC

## 2015-04-17 NOTE — Telephone Encounter (Signed)
i have recd faxed rx request for refill on losartan 100mg  tab from [facility] in [place], [place]-----this pharm is not patient's chart---advised patient to call us back to make sure he has requested this refill from them----let Dakota Abbott know when patient calls back so that refill can be sent if he wants refill to go there

## 2015-04-22 ENCOUNTER — Encounter: Payer: Self-pay | Admitting: Internal Medicine

## 2015-04-22 ENCOUNTER — Other Ambulatory Visit: Payer: Self-pay | Admitting: Geriatric Medicine

## 2015-04-22 DIAGNOSIS — Z0000 Encounter for general adult medical examination without abnormal findings: Secondary | ICD-10-CM

## 2015-04-22 MED ORDER — LOSARTAN POTASSIUM 100 MG PO TABS: 100.0000 mg | ORAL_TABLET | Freq: Every day | ORAL | Status: DC

## 2015-04-22 MED ORDER — MELOXICAM 15 MG PO TABS: 15.0000 mg | ORAL_TABLET | Freq: Every day | ORAL | Status: DC

## 2015-04-22 MED ORDER — GLIMEPIRIDE 4 MG PO TABS: ORAL_TABLET | ORAL | Status: DC

## 2015-04-22 MED ORDER — AMLODIPINE BESYLATE 5 MG PO TABS: ORAL_TABLET | ORAL | Status: DC

## 2015-04-22 MED ORDER — METFORMIN HCL ER 500 MG PO TB24: ORAL_TABLET | ORAL | Status: DC

## 2015-05-24 ENCOUNTER — Encounter: Payer: Self-pay | Admitting: Internal Medicine

## 2015-05-24 ENCOUNTER — Telehealth: Payer: Self-pay

## 2015-05-24 MED ORDER — TRIAMCINOLONE ACETONIDE 0.1 % EX CREA: TOPICAL_CREAM | Freq: Two times a day (BID) | CUTANEOUS | Status: DC

## 2015-05-24 NOTE — Telephone Encounter (Signed)
Patient called back about the RX that was sent the mail order he needed it now . He wanted it sent to the [facility] on [address].

## 2015-05-24 NOTE — Telephone Encounter (Signed)
rx pended for PCP review.

## 2015-05-27 ENCOUNTER — Other Ambulatory Visit: Payer: Self-pay | Admitting: Geriatric Medicine

## 2015-05-27 NOTE — Telephone Encounter (Signed)
Spoke with patient and his mail order didn't have the cream in stock. [facility] has it and it has been taken care of.

## 2015-06-14 ENCOUNTER — Other Ambulatory Visit: Payer: Self-pay | Admitting: Internal Medicine

## 2015-06-24 ENCOUNTER — Encounter: Payer: PRIVATE HEALTH INSURANCE | Admitting: Internal Medicine

## 2015-06-24 ENCOUNTER — Other Ambulatory Visit: Payer: Self-pay | Admitting: Internal Medicine

## 2015-07-08 ENCOUNTER — Ambulatory Visit (INDEPENDENT_AMBULATORY_CARE_PROVIDER_SITE_OTHER): Payer: PRIVATE HEALTH INSURANCE | Admitting: Internal Medicine

## 2015-07-08 ENCOUNTER — Other Ambulatory Visit (INDEPENDENT_AMBULATORY_CARE_PROVIDER_SITE_OTHER): Payer: PRIVATE HEALTH INSURANCE

## 2015-07-08 ENCOUNTER — Encounter: Payer: Self-pay | Admitting: Internal Medicine

## 2015-07-08 VITALS — BP 110/70 | HR 89 | Temp 98.1°F | Resp 20 | Ht 70.0 in | Wt 258.0 lb

## 2015-07-08 DIAGNOSIS — E669 Obesity, unspecified: Secondary | ICD-10-CM | POA: Diagnosis not present

## 2015-07-08 DIAGNOSIS — J309 Allergic rhinitis, unspecified: Secondary | ICD-10-CM | POA: Diagnosis not present

## 2015-07-08 DIAGNOSIS — Z0000 Encounter for general adult medical examination without abnormal findings: Secondary | ICD-10-CM

## 2015-07-08 DIAGNOSIS — I10 Essential (primary) hypertension: Secondary | ICD-10-CM

## 2015-07-08 DIAGNOSIS — E119 Type 2 diabetes mellitus without complications: Secondary | ICD-10-CM

## 2015-07-08 DIAGNOSIS — R7989 Other specified abnormal findings of blood chemistry: Secondary | ICD-10-CM | POA: Diagnosis not present

## 2015-07-08 LAB — COMPREHENSIVE METABOLIC PANEL
ALT: 84 U/L — ABNORMAL HIGH (ref 0–53)
AST: 69 U/L — AB (ref 0–37)
Albumin: 4.6 g/dL (ref 3.5–5.2)
Alkaline Phosphatase: 33 U/L — ABNORMAL LOW (ref 39–117)
BUN: 25 mg/dL — ABNORMAL HIGH (ref 6–23)
CALCIUM: 10 mg/dL (ref 8.4–10.5)
CHLORIDE: 107 meq/L (ref 96–112)
CO2: 21 meq/L (ref 19–32)
Creatinine, Ser: 1.33 mg/dL (ref 0.40–1.50)
GFR: 56.54 mL/min — AB (ref >60.00)
Glucose, Bld: 160 mg/dL — ABNORMAL HIGH (ref 70–99)
Potassium: 4.3 mEq/L (ref 3.5–5.1)
Sodium: 137 mEq/L (ref 135–145)
Total Bilirubin: 1.1 mg/dL (ref 0.2–1.2)
Total Protein: 7.6 g/dL (ref 6.0–8.3)

## 2015-07-08 LAB — LIPID PANEL
CHOLESTEROL: 138 mg/dL (ref 0–200)
HDL: 32.3 mg/dL — ABNORMAL LOW (ref >39.00)
NonHDL: 105.54
TRIGLYCERIDES: 263 mg/dL — AB (ref 0.0–149.0)
Total CHOL/HDL Ratio: 4
VLDL: 52.6 mg/dL — ABNORMAL HIGH (ref 0.0–40.0)

## 2015-07-08 LAB — CBC
HEMATOCRIT: 40 % (ref 39.0–52.0)
HEMOGLOBIN: 13.9 g/dL (ref 13.0–17.0)
MCHC: 34.8 g/dL (ref 30.0–36.0)
MCV: 93.9 fl (ref 78.0–100.0)
PLATELETS: 207 10*3/uL (ref 150.0–400.0)
RBC: 4.25 Mil/uL (ref 4.22–5.81)
RDW: 12.9 % (ref 11.5–15.5)
WBC: 6.9 10*3/uL (ref 4.0–10.5)

## 2015-07-08 LAB — HEMOGLOBIN A1C: Hgb A1c MFr Bld: 6.5 % (ref 4.6–6.5)

## 2015-07-08 LAB — LDL CHOLESTEROL, DIRECT: Direct LDL: 70 mg/dL

## 2015-07-08 MED ORDER — CETIRIZINE HCL 10 MG PO TABS: 10.0000 mg | ORAL_TABLET | Freq: Every day | ORAL | Status: DC

## 2015-07-08 MED ORDER — TRIAMCINOLONE ACETONIDE 0.1 % EX CREA: TOPICAL_CREAM | Freq: Two times a day (BID) | CUTANEOUS | Status: DC

## 2015-07-08 NOTE — Assessment & Plan Note (Signed)
Checking labs, up to date on immunizations and gets flu shot yearly. Counseled on the need for sun safety and mole surveillance. Given screening recommendations.

## 2015-07-08 NOTE — Assessment & Plan Note (Signed)
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

## 2015-07-08 NOTE — Assessment & Plan Note (Signed)
Getting eye exam in 1 month, not complicated, foot exam done today. Checking HgA1c, taking metformin and amaryl and no low sugars. Taking losartan. Not on statin and checking lipid panel today.

## 2015-07-08 NOTE — Progress Notes (Signed)
   Subjective:    Patient ID: Dakota Abbott, male    DOB: 07/05/1945, 70 y.o.   MRN: [id number]  HPI The patient is a 70 YO man coming in for wellness. No new concerns. Having some mild allergies.   PMH, [facility], social history reviewed and updated.   Review of Systems  Constitutional: Negative for fever, activity change, appetite change, fatigue and unexpected weight change.  HENT: Negative.   Eyes: Negative.   Respiratory: Negative for cough, chest tightness, shortness of breath and wheezing.   Cardiovascular: Negative for chest pain, palpitations and leg swelling.  Gastrointestinal: Negative for nausea, abdominal pain, diarrhea, constipation and abdominal distention.  Musculoskeletal: Positive for arthralgias. Negative for myalgias, back pain and gait problem.  Skin: Positive for rash. Negative for color change, pallor and wound.  Neurological: Negative.   Psychiatric/Behavioral: Negative.       Objective:   Physical Exam  Constitutional: He is oriented to person, place, and time. He appears well-developed and well-nourished.  Overweight  HENT:  Head: Normocephalic and atraumatic.  Eyes: EOM are normal.  Neck: Normal range of motion.  Cardiovascular: Normal rate and regular rhythm.   Pulmonary/Chest: Effort normal and breath sounds normal. No respiratory distress. He has no wheezes. He has no rales.  Abdominal: Soft. He exhibits no distension. There is no tenderness. There is no rebound.  Musculoskeletal: He exhibits no edema.  Neurological: He is alert and oriented to person, place, and time. Coordination normal.  Skin: Skin is warm and dry. No rash noted.  See foot exam  Psychiatric: He has a normal mood and affect.   Filed Vitals:   07/08/15 0800  BP: 110/70  Pulse: 89  Temp: 98.1 F (36.7 C)  TempSrc: Oral  Resp: 20  Height: 5\' 10"  (1.778 m)  Weight: 258 lb (117.028 kg)  SpO2: 98%      Assessment & Plan:

## 2015-07-08 NOTE — Patient Instructions (Signed)
We are checking the blood work today and will send the results on mychart.   We have sent in the refill of the bigger tube of cream and also the medicine zyrtec (cetirizine) for the allergies.   Health Maintenance, Male A healthy lifestyle and preventative care can promote health and wellness.  Maintain regular health, dental, and eye exams.  Eat a healthy diet. Foods like vegetables, fruits, whole grains, low-fat dairy products, and lean protein foods contain the nutrients you need and are low in calories. Decrease your intake of foods high in solid fats, added sugars, and salt. Get information about a proper diet from your health care provider, if necessary.  Regular physical exercise is one of the most important things you can do for your health. Most adults should get at least 150 minutes of moderate-intensity exercise (any activity that increases your heart rate and causes you to sweat) each week. In addition, most adults need muscle-strengthening exercises on 2 or more days a week.   Maintain a healthy weight. The body mass index (BMI) is a screening tool to identify possible weight problems. It provides an estimate of body fat based on height and weight. Your health care provider can find your BMI and can help you achieve or maintain a healthy weight. For males 20 years and older:  A BMI below 18.5 is considered underweight.  A BMI of 18.5 to 24.9 is normal.  A BMI of 25 to 29.9 is considered overweight.  A BMI of 30 and above is considered obese.  Maintain normal blood lipids and cholesterol by exercising and minimizing your intake of saturated fat. Eat a balanced diet with plenty of fruits and vegetables. Blood tests for lipids and cholesterol should begin at age 61 and be repeated every 5 years. If your lipid or cholesterol levels are high, you are over age 22, or you are at high risk for heart disease, you may need your cholesterol levels checked more frequently.Ongoing high lipid  and cholesterol levels should be treated with medicines if diet and exercise are not working.  If you smoke, find out from your health care provider how to quit. If you do not use tobacco, do not start.  Lung cancer screening is recommended for adults aged 40-80 years who are at high risk for developing lung cancer because of a history of smoking. A yearly low-dose CT scan of the lungs is recommended for people who have at least a 30-pack-year history of smoking and are current smokers or have quit within the past 15 years. A pack year of smoking is smoking an average of 1 pack of cigarettes a day for 1 year (for example, a 30-pack-year history of smoking could mean smoking 1 pack a day for 30 years or 2 packs a day for 15 years). Yearly screening should continue until the smoker has stopped smoking for at least 15 years. Yearly screening should be stopped for people who develop a health problem that would prevent them from having lung cancer treatment.  If you choose to drink alcohol, do not have more than 2 drinks per day. One drink is considered to be 12 oz (360 mL) of beer, 5 oz (150 mL) of wine, or 1.5 oz (45 mL) of liquor.  Avoid the use of street drugs. Do not share needles with anyone. Ask for help if you need support or instructions about stopping the use of drugs.  High blood pressure causes heart disease and increases the risk of stroke.  High blood pressure is more likely to develop in:  People who have blood pressure in the end of the normal range (100-139/85-89 mm Hg).  People who are overweight or obese.  People who are African American.  If you are 17-23 years of age, have your blood pressure checked every 3-5 years. If you are 63 years of age or older, have your blood pressure checked every year. You should have your blood pressure measured twice--once when you are at a hospital or clinic, and once when you are not at a hospital or clinic. Record the average of the two measurements.  To check your blood pressure when you are not at a hospital or clinic, you can use:  An automated blood pressure machine at a pharmacy.  A home blood pressure monitor.  If you are 34-57 years old, ask your health care provider if you should take aspirin to prevent heart disease.  Diabetes screening involves taking a blood sample to check your fasting blood sugar level. This should be done once every 3 years after age 4 if you are at a normal weight and without risk factors for diabetes. Testing should be considered at a younger age or be carried out more frequently if you are overweight and have at least 1 risk factor for diabetes.  Colorectal cancer can be detected and often prevented. Most routine colorectal cancer screening begins at the age of 101 and continues through age 73. However, your health care provider may recommend screening at an earlier age if you have risk factors for colon cancer. On a yearly basis, your health care provider may provide home test kits to check for hidden blood in the stool. A small camera at the end of a tube may be used to directly examine the colon (sigmoidoscopy or colonoscopy) to detect the earliest forms of colorectal cancer. Talk to your health care provider about this at age 67 when routine screening begins. A direct exam of the colon should be repeated every 5-10 years through age 67, unless early forms of precancerous polyps or small growths are found.  People who are at an increased risk for hepatitis B should be screened for this virus. You are considered at high risk for hepatitis B if:  You were born in a country where hepatitis B occurs often. Talk with your health care provider about which countries are considered high risk.  Your parents were born in a high-risk country and you have not received a shot to protect against hepatitis B (hepatitis B vaccine).  You have HIV or AIDS.  You use needles to inject street drugs.  You live with, or have  sex with, someone who has hepatitis B.  You are a man who has sex with other men (MSM).  You get hemodialysis treatment.  You take certain medicines for conditions like cancer, organ transplantation, and autoimmune conditions.  Hepatitis C blood testing is recommended for all people born from [birth year] through 1965 and any individual with known risk factors for hepatitis C.  Healthy men should no longer receive prostate-specific antigen (PSA) blood tests as part of routine cancer screening. Talk to your health care provider about prostate cancer screening.  Testicular cancer screening is not recommended for adolescents or adult males who have no symptoms. Screening includes self-exam, a health care provider exam, and other screening tests. Consult with your health care provider about any symptoms you have or any concerns you have about testicular cancer.  Practice safe sex. Use condoms  and avoid high-risk sexual practices to reduce the spread of sexually transmitted infections (STIs).  You should be screened for STIs, including gonorrhea and chlamydia if:  You are sexually active and are younger than 24 years.  You are older than 24 years, and your health care provider tells you that you are at risk for this type of infection.  Your sexual activity has changed since you were last screened, and you are at an increased risk for chlamydia or gonorrhea. Ask your health care provider if you are at risk.  If you are at risk of being infected with HIV, it is recommended that you take a prescription medicine daily to prevent HIV infection. This is called pre-exposure prophylaxis (PrEP). You are considered at risk if:  You are a man who has sex with other men (MSM).  You are a heterosexual man who is sexually active with multiple partners.  You take drugs by injection.  You are sexually active with a partner who has HIV.  Talk with your health care provider about whether you are at high risk of  being infected with HIV. If you choose to begin PrEP, you should first be tested for HIV. You should then be tested every 3 months for as long as you are taking PrEP.  Use sunscreen. Apply sunscreen liberally and repeatedly throughout the day. You should seek shade when your shadow is shorter than you. Protect yourself by wearing long sleeves, pants, a wide-brimmed hat, and sunglasses year round whenever you are outdoors.  Tell your health care provider of new moles or changes in moles, especially if there is a change in shape or color. Also, tell your health care provider if a mole is larger than the size of a pencil eraser.  A one-time screening for abdominal aortic aneurysm (AAA) and surgical repair of large AAAs by ultrasound is recommended for men aged 50-75 years who are current or former smokers.  Stay current with your vaccines (immunizations).   This information is not intended to replace advice given to you by your health care provider. Make sure you discuss any questions you have with your health care provider.   Document Released: 06/20/2007 Document Revised: 01/12/2014 Document Reviewed: 05/19/2010 Elsevier Interactive Patient Education 2016 [employer].  Diabetes and Standards of Medical Care Diabetes is complicated. You may find that your diabetes team includes a dietitian, nurse, diabetes educator, eye doctor, and more. To help everyone know what is going on and to help you get the care you deserve, the following schedule of care was developed to help keep you on track. Below are the tests, exams, vaccines, medicines, education, and plans you will need. HbA1c test This test shows how well you have controlled your glucose over the past 2-3 months. It is used to see if your diabetes management plan needs to be adjusted.   It is performed at least 2 times a year if you are meeting treatment goals.  It is performed 4 times a year if therapy has changed or if you are not meeting  treatment goals. Blood pressure test  This test is performed at every routine medical visit. The goal is less than 140/90 mm Hg for most people, but 130/80 mm Hg in some cases. Ask your health care provider about your goal. Dental exam  Follow up with the dentist regularly. Eye exam  If you are diagnosed with type 1 diabetes as a child, get an exam upon reaching the age of 43 years or  older and having had diabetes for 3-5 years. Yearly eye exams are recommended after that initial eye exam.  If you are diagnosed with type 1 diabetes as an adult, get an exam within 5 years of diagnosis and then yearly.  If you are diagnosed with type 2 diabetes, get an exam as soon as possible after the diagnosis and then yearly. Foot care exam  Visual foot exams are performed at every routine medical visit. The exams check for cuts, injuries, or other problems with the feet.  You should have a complete foot exam performed every year. This exam includes an inspection of the structure and skin of your feet, a check of the pulses in your feet, and a check of the sensation in your feet.  Type 1 diabetes: The first exam is performed 5 years after diagnosis.  Type 2 diabetes: The first exam is performed at the time of diagnosis.  Check your feet nightly for cuts, injuries, or other problems with your feet. Tell your health care provider if anything is not healing. Kidney function test (urine microalbumin)  This test is performed once a year.  Type 1 diabetes: The first test is performed 5 years after diagnosis.  Type 2 diabetes: The first test is performed at the time of diagnosis.  A serum creatinine and estimated glomerular filtration rate (eGFR) test is done once a year to assess the level of chronic kidney disease (CKD), if present. Lipid profile (cholesterol, HDL, LDL, triglycerides)  Performed every 5 years for most people.  The goal for LDL is less than 100 mg/dL. If you are at high risk, the  goal is less than 70 mg/dL.  The goal for HDL is 40 mg/dL-50 mg/dL for men and 50 mg/dL-60 mg/dL for women. An HDL cholesterol of 60 mg/dL or higher gives some protection against heart disease.  The goal for triglycerides is less than 150 mg/dL. Immunizations  The flu (influenza) vaccine is recommended yearly for every person 82 months of age or older who has diabetes.  The pneumonia (pneumococcal) vaccine is recommended for every person 68 years of age or older who has diabetes. Adults 61 years of age or older may receive the pneumonia vaccine as a series of two separate shots.  The hepatitis B vaccine is recommended for adults shortly after they have been diagnosed with diabetes.  The Tdap (tetanus, diphtheria, and pertussis) vaccine should be given:  According to normal childhood vaccination schedules, for children.  Every 10 years, for adults who have diabetes. Diabetes self-management education  Education is recommended at diagnosis and ongoing as needed. Treatment plan  Your treatment plan is reviewed at every medical visit.   This information is not intended to replace advice given to you by your health care provider. Make sure you discuss any questions you have with your health care provider.   Document Released: 10/19/2008 Document Revised: 01/12/2014 Document Reviewed: 05/24/2012 Elsevier Interactive Patient Education [employer].

## 2015-07-08 NOTE — Assessment & Plan Note (Signed)
BP at goal on losartan and amlodipine. Checking CMP and adjust as needed.

## 2015-07-08 NOTE — Progress Notes (Signed)
Pre visit review using our clinic review tool, if applicable. No additional management support is needed unless otherwise documented below in the visit note. 

## 2015-07-28 ENCOUNTER — Encounter: Payer: Self-pay | Admitting: Internal Medicine

## 2015-07-29 ENCOUNTER — Encounter: Payer: Self-pay | Admitting: Internal Medicine

## 2015-11-11 ENCOUNTER — Other Ambulatory Visit: Payer: Self-pay | Admitting: Internal Medicine

## 2015-11-11 DIAGNOSIS — Z0000 Encounter for general adult medical examination without abnormal findings: Secondary | ICD-10-CM

## 2016-01-14 ENCOUNTER — Encounter: Payer: Self-pay | Admitting: Internal Medicine

## 2016-01-15 ENCOUNTER — Telehealth: Payer: Self-pay

## 2016-01-15 DIAGNOSIS — Z0000 Encounter for general adult medical examination without abnormal findings: Secondary | ICD-10-CM

## 2016-01-15 MED ORDER — RA CENTRAL-VITE MENS MATURE PO TABS: 1.0000 | ORAL_TABLET | Freq: Every day | ORAL | 11 refills | Status: DC

## 2016-01-15 MED ORDER — GLIMEPIRIDE 4 MG PO TABS: 4.0000 mg | ORAL_TABLET | Freq: Every day | ORAL | 2 refills | Status: DC

## 2016-01-15 MED ORDER — METFORMIN HCL ER 500 MG PO TB24: ORAL_TABLET | ORAL | 3 refills | Status: DC

## 2016-01-15 MED ORDER — LOSARTAN POTASSIUM 100 MG PO TABS: 100.0000 mg | ORAL_TABLET | Freq: Every day | ORAL | 3 refills | Status: DC

## 2016-01-15 MED ORDER — AMLODIPINE BESYLATE 5 MG PO TABS: ORAL_TABLET | ORAL | 3 refills | Status: DC

## 2016-01-15 MED ORDER — ASPIRIN 81 MG PO TBEC: 81.0000 mg | DELAYED_RELEASE_TABLET | Freq: Every day | ORAL | 2 refills | Status: DC

## 2016-01-15 MED ORDER — MELOXICAM 15 MG PO TABS
15.0000 mg | ORAL_TABLET | Freq: Every day | ORAL | 3 refills | Status: DC
Start: 2016-01-15 — End: 2016-02-11

## 2016-01-15 MED ORDER — TRIAMCINOLONE ACETONIDE 0.1 % EX CREA: TOPICAL_CREAM | Freq: Two times a day (BID) | CUTANEOUS | 1 refills | Status: DC

## 2016-01-15 MED ORDER — CETIRIZINE HCL 10 MG PO TABS: 10.0000 mg | ORAL_TABLET | Freq: Every day | ORAL | 11 refills | Status: AC

## 2016-01-15 NOTE — Telephone Encounter (Signed)
[facility], and reordered with refills, called and patient stated understanding

## 2016-01-16 ENCOUNTER — Telehealth: Payer: Self-pay | Admitting: Internal Medicine

## 2016-01-20 ENCOUNTER — Telehealth: Payer: Self-pay | Admitting: Internal Medicine

## 2016-01-20 NOTE — Telephone Encounter (Signed)
Wife called in stating that spouse would be more comfortable with a male.  Would like to know if Patient could transfer to [facility]?

## 2016-01-20 NOTE — Telephone Encounter (Signed)
Ok with me 

## 2016-01-20 NOTE — Telephone Encounter (Signed)
Fine with me

## 2016-01-21 NOTE — Telephone Encounter (Signed)
Tried to contact patient twice - phone would not ring.

## 2016-01-22 NOTE — Telephone Encounter (Signed)
Spouse wanted to know if cream could be refilled for patient.

## 2016-01-24 MED ORDER — TRIAMCINOLONE ACETONIDE 0.1 % EX CREA: TOPICAL_CREAM | Freq: Two times a day (BID) | CUTANEOUS | 1 refills | Status: DC

## 2016-01-24 NOTE — Telephone Encounter (Signed)
Sent in

## 2016-01-24 NOTE — Addendum Note (Signed)
Addended by: Pricilla Holm A on: 01/24/2016 09:41 AM   Modules accepted: Orders

## 2016-02-11 ENCOUNTER — Encounter: Payer: Self-pay | Admitting: Internal Medicine

## 2016-02-11 ENCOUNTER — Other Ambulatory Visit (INDEPENDENT_AMBULATORY_CARE_PROVIDER_SITE_OTHER): Payer: BLUE CROSS/BLUE SHIELD

## 2016-02-11 ENCOUNTER — Ambulatory Visit (INDEPENDENT_AMBULATORY_CARE_PROVIDER_SITE_OTHER)
Admission: RE | Admit: 2016-02-11 | Discharge: 2016-02-11 | Disposition: A | Discharge status: Home / Self Care | Payer: BLUE CROSS/BLUE SHIELD | Source: Ambulatory Visit | Attending: Internal Medicine | Admitting: Internal Medicine

## 2016-02-11 ENCOUNTER — Ambulatory Visit (INDEPENDENT_AMBULATORY_CARE_PROVIDER_SITE_OTHER): Payer: BLUE CROSS/BLUE SHIELD | Admitting: Internal Medicine

## 2016-02-11 VITALS — BP 140/90 | HR 80 | Temp 98.3°F | Ht 70.0 in | Wt 250.5 lb

## 2016-02-11 DIAGNOSIS — K5900 Constipation, unspecified: Secondary | ICD-10-CM | POA: Diagnosis not present

## 2016-02-11 DIAGNOSIS — N189 Chronic kidney disease, unspecified: Secondary | ICD-10-CM | POA: Insufficient documentation

## 2016-02-11 DIAGNOSIS — M79651 Pain in right thigh: Secondary | ICD-10-CM

## 2016-02-11 DIAGNOSIS — R059 Cough, unspecified: Secondary | ICD-10-CM

## 2016-02-11 DIAGNOSIS — R140 Abdominal distension (gaseous): Secondary | ICD-10-CM

## 2016-02-11 DIAGNOSIS — N179 Acute kidney failure, unspecified: Secondary | ICD-10-CM

## 2016-02-11 DIAGNOSIS — R05 Cough: Secondary | ICD-10-CM | POA: Insufficient documentation

## 2016-02-11 LAB — CBC WITH DIFFERENTIAL/PLATELET
BASOS PCT: 0.6 % (ref 0.0–3.0)
Basophils Absolute: 0.1 10*3/uL (ref 0.0–0.1)
EOS PCT: 3.2 % (ref 0.0–5.0)
Eosinophils Absolute: 0.3 10*3/uL (ref 0.0–0.7)
HCT: 41.1 % (ref 39.0–52.0)
Hemoglobin: 14.4 g/dL (ref 13.0–17.0)
LYMPHS ABS: 1.7 10*3/uL (ref 0.7–4.0)
Lymphocytes Relative: 16.7 % (ref 12.0–46.0)
MCHC: 35.1 g/dL (ref 30.0–36.0)
MCV: 92.8 fl (ref 78.0–100.0)
MONO ABS: 0.8 10*3/uL (ref 0.1–1.0)
Monocytes Relative: 8.1 % (ref 3.0–12.0)
NEUTROS PCT: 71.4 % (ref 43.0–77.0)
Neutro Abs: 7.2 10*3/uL (ref 1.4–7.7)
Platelets: 208 10*3/uL (ref 150.0–400.0)
RBC: 4.42 Mil/uL (ref 4.22–5.81)
RDW: 12.8 % (ref 11.5–15.5)
WBC: 10.1 10*3/uL (ref 4.0–10.5)

## 2016-02-11 LAB — COMPREHENSIVE METABOLIC PANEL
ALBUMIN: 4.4 g/dL (ref 3.5–5.2)
ALK PHOS: 34 U/L — AB (ref 39–117)
ALT: 40 U/L (ref 0–53)
AST: 31 U/L (ref 0–37)
BUN: 32 mg/dL — ABNORMAL HIGH (ref 6–23)
CO2: 25 mEq/L (ref 19–32)
CREATININE: 2.94 mg/dL — AB (ref 0.40–1.50)
Calcium: 10.1 mg/dL (ref 8.4–10.5)
Chloride: 106 mEq/L (ref 96–112)
GFR: 22.6 mL/min — ABNORMAL LOW (ref >60.00)
GLUCOSE: 125 mg/dL — AB (ref 70–99)
POTASSIUM: 4.7 meq/L (ref 3.5–5.1)
SODIUM: 137 meq/L (ref 135–145)
TOTAL PROTEIN: 7.4 g/dL (ref 6.0–8.3)
Total Bilirubin: 0.9 mg/dL (ref 0.2–1.2)

## 2016-02-11 LAB — THYROID PANEL WITH TSH
Free Thyroxine Index: 3.2 (ref 1.4–3.8)
T3 UPTAKE: 32 % (ref 22–35)
T4 TOTAL: 9.9 ug/dL (ref 4.5–12.0)
TSH: 0.95 mIU/L (ref 0.40–4.50)

## 2016-02-11 LAB — MAGNESIUM: MAGNESIUM: 2 mg/dL (ref 1.5–2.5)

## 2016-02-11 MED ORDER — LINACLOTIDE 290 MCG PO CAPS: 290.0000 ug | ORAL_CAPSULE | Freq: Every day | ORAL | 1 refills | Status: DC

## 2016-02-11 NOTE — Patient Instructions (Signed)
Constipation, Adult °Constipation is when a person: °· Poops (has a bowel movement) fewer times in a week than normal. °· Has a hard time pooping. °· Has poop that is dry, hard, or bigger than normal. ° °Follow these instructions at home: °Eating and drinking ° °· Eat foods that have a lot of fiber, such as: °? Fresh fruits and vegetables. °? Whole grains. °? Beans. °· Eat less of foods that are high in fat, low in fiber, or overly processed, such as: °? French fries. °? Hamburgers. °? Cookies. °? Candy. °? Soda. °· Drink enough fluid to keep your pee (urine) clear or pale yellow. °General instructions °· Exercise regularly or as told by your doctor. °· Go to the restroom when you feel like you need to poop. Do not hold it in. °· Take over-the-counter and prescription medicines only as told by your doctor. These include any fiber supplements. °· Do pelvic floor retraining exercises, such as: °? Doing deep breathing while relaxing your lower belly (abdomen). °? Relaxing your pelvic floor while pooping. °· Watch your condition for any changes. °· Keep all follow-up visits as told by your doctor. This is important. °Contact a doctor if: °· You have pain that gets worse. °· You have a fever. °· You have not pooped for 4 days. °· You throw up (vomit). °· You are not hungry. °· You lose weight. °· You are bleeding from the anus. °· You have thin, pencil-like poop (stool). °Get help right away if: °· You have a fever, and your symptoms suddenly get worse. °· You leak poop or have blood in your poop. °· Your belly feels hard or bigger than normal (is bloated). °· You have very bad belly pain. °· You feel dizzy or you faint. °This information is not intended to replace advice given to you by your health care provider. Make sure you discuss any questions you have with your health care provider. °Document Released: 06/10/2007 Document Revised: 07/12/2015 Document Reviewed: 06/12/2015 °Elsevier Interactive Patient Education ©  2017 Elsevier Inc. ° °

## 2016-02-11 NOTE — Progress Notes (Signed)
Pre visit review using our clinic review tool, if applicable. No additional management support is needed unless otherwise documented below in the visit note. 

## 2016-02-11 NOTE — Progress Notes (Signed)
Subjective:  Patient ID: Dakota Abbott, male    DOB: 01-Feb-1945  Age: 71 y.o. MRN: [id number]  CC: Cough and Abdominal Pain  New to me  HPI Marian Denk presents for for complaints of a 2 day history of no bowel movements, bloating, nausea, nonproductive cough, and poor appetite. He denies vomiting, fever, or chills. He has tried Dulcolax without much symptom relief.   He also complains of a 2 week history of discomfort in his right distal anterior thigh. He has a prior history of both colon and prostate cancer. He takes NSAIDs for the pain.  Outpatient Medications Prior to Visit  Medication Sig Dispense Refill  . amLODipine (NORVASC) 5 MG tablet TAKE 1 TABLET BY MOUTH DAILY 90 tablet 3  . aspirin (RA ASPIRIN EC) 81 MG EC tablet Take 1 tablet (81 mg total) by mouth daily. Swallow whole. 30 tablet 2  . cetirizine (ZYRTEC) 10 MG tablet Take 1 tablet (10 mg total) by mouth daily. 30 tablet 11  . glimepiride (AMARYL) 4 MG tablet Take 1 tablet (4 mg total) by mouth daily. 90 tablet 2  . losartan (COZAAR) 100 MG tablet Take 1 tablet (100 mg total) by mouth daily. 90 tablet 3  . Multiple Vitamins-Minerals (RA CENTRAL-VITE MENS MATURE) TABS Take 1 tablet by mouth daily. 100 tablet 11  . triamcinolone cream (KENALOG) 0.1 % Apply topically 2 (two) times daily. 80 g 1  . meloxicam (MOBIC) 15 MG tablet Take 1 tablet (15 mg total) by mouth daily. 90 tablet 3  . metFORMIN (GLUCOPHAGE-XR) 500 MG 24 hr tablet Take 2 tablets by mouth  every morning and 2 tablets every evening 360 tablet 3   No facility-administered medications prior to visit.     ROS Review of Systems  Constitutional: Positive for appetite change. Negative for activity change, diaphoresis, fatigue and unexpected weight change.  Eyes: Negative for visual disturbance.  Respiratory: Positive for cough. Negative for chest tightness, shortness of breath and wheezing.   Cardiovascular: Negative for chest pain, palpitations and leg  swelling.  Gastrointestinal: Positive for abdominal distention and constipation. Negative for abdominal pain, blood in stool, diarrhea, nausea and vomiting.  Endocrine: Negative.   Genitourinary: Negative.  Negative for decreased urine volume, difficulty urinating, dysuria, flank pain, hematuria and urgency.  Musculoskeletal: Positive for arthralgias (right thigh). Negative for back pain, myalgias and neck pain.  Skin: Negative.  Negative for rash.  Allergic/Immunologic: Negative.   Neurological: Negative.   Hematological: Negative.   Psychiatric/Behavioral: Negative.     Objective:  BP 140/90 (BP Location: Left Arm, Patient Position: Sitting, Cuff Size: Large)   Pulse 80   Temp 98.3 F (36.8 C) (Oral)   Ht 5\' 10"  (1.778 m)   Wt 250 lb 8 oz (113.6 kg)   SpO2 96%   BMI 35.94 kg/m   BP Readings from Last 3 Encounters:  02/11/16 140/90  07/08/15 110/70  05/18/14 (!) 120/58    Wt Readings from Last 3 Encounters:  02/11/16 250 lb 8 oz (113.6 kg)  07/08/15 258 lb (117 kg)  05/18/14 258 lb (117 kg)    Physical Exam  Constitutional: He is oriented to person, place, and time.  Non-toxic appearance. He does not have a sickly appearance. He does not appear ill. No distress.  HENT:  Mouth/Throat: Oropharynx is clear and moist. No oropharyngeal exudate.  Eyes: Conjunctivae are normal. Right eye exhibits no discharge. Left eye exhibits no discharge. No scleral icterus.  Neck: Normal range of motion. Neck  supple. No JVD present. No tracheal deviation present. No thyromegaly present.  Cardiovascular: Normal rate, regular rhythm, normal heart sounds and intact distal pulses.  Exam reveals no gallop and no friction rub.   No murmur heard. Pulmonary/Chest: Effort normal and breath sounds normal. No stridor. No respiratory distress. He has no wheezes. He has no rales. He exhibits no tenderness.  Abdominal: Soft. Normal appearance and bowel sounds are normal. He exhibits no distension and no  mass. There is no hepatosplenomegaly or hepatomegaly. There is no tenderness. There is no rebound, no guarding and no CVA tenderness.  Musculoskeletal: Normal range of motion. He exhibits no edema, tenderness or deformity.       Right knee: Normal. He exhibits normal range of motion.       Right upper leg: Normal. He exhibits no tenderness, no bony tenderness, no swelling, no edema, no deformity and no laceration.  Lymphadenopathy:    He has no cervical adenopathy.  Neurological: He is oriented to person, place, and time.  Skin: Skin is warm and dry. No rash noted. He is not diaphoretic. No erythema. No pallor.  Vitals reviewed.   Lab Results  Component Value Date   WBC 10.1 02/11/2016   HGB 14.4 02/11/2016   HCT 41.1 02/11/2016   PLT 208.0 02/11/2016   GLUCOSE 125 (H) 02/11/2016   CHOL 138 07/08/2015   TRIG 263.0 (H) 07/08/2015   HDL 32.30 (L) 07/08/2015   LDLDIRECT 70.0 07/08/2015   LDLCALC 67 01/30/2013   ALT 40 02/11/2016   AST 31 02/11/2016   NA 137 02/11/2016   K 4.7 02/11/2016   CL 106 02/11/2016   CREATININE 2.94 (H) 02/11/2016   BUN 32 (H) 02/11/2016   CO2 25 02/11/2016   TSH 0.95 02/11/2016   PSA 1.34 12/07/2011   HGBA1C 6.5 07/08/2015   MICROALBUR 3.7 (H) 05/05/2010    Dg Chest 2 View  Result Date: 05/15/2013 CLINICAL DATA:  Shortness of breath. EXAM: CHEST  2 VIEW COMPARISON:  None. FINDINGS: The heart size and mediastinal contours are within normal limits. Both lungs are clear. No pneumothorax or pleural effusion is noted. The visualized skeletal structures are unremarkable. IMPRESSION: No acute cardiopulmonary abnormality seen. Electronically Signed   By: Sabino Dick M.D.   On: 05/15/2013 09:05   No results found.   Assessment & Plan:   Carvell was seen today for cough and abdominal pain.  Diagnoses and all orders for this visit:  Cough- His exam is normal, his chest x-ray is normal, and he has no symptoms of bacterial infection. This appears to be a  viral URI and will treat expectantly. -     DG Abd Acute W/Chest; Future  Abdominal distension- his plain films are unremarkable. Will treat for constipation. -     DG Abd Acute W/Chest; Future  Acute pain of right thigh- there is no evidence of metastatic disease on plain films. He does have DJD in his right knee. Will pursue treatment of this after his renal function is stabilized. -     DG FEMUR, MIN 2 VIEWS RIGHT; Future  Constipation, unspecified constipation type- his labs are negative for any secondary or metabolic causes for constipation. The only medication he takes that could constipate is amlodipine. Will treat the constipation with Linzess. -     Magnesium; Future -     Comprehensive metabolic panel; Future -     CBC with Differential/Platelet; Future -     Thyroid Panel With TSH; Future -  linaclotide (LINZESS) 290 MCG CAPS capsule; Take 1 capsule (290 mcg total) by mouth daily before breakfast.  Acute renal failure superimposed on chronic kidney disease, unspecified CKD stage, unspecified acute renal failure type ([place])- his last creatinine about 6 months ago was 1.33. Today it's up to 2.94. I've asked him to stop the nephrotoxic agents meloxicam and metformin. I've asked him to see nephrology urgently for a more thorough evaluation. -     Ambulatory referral to Nephrology   I have discontinued Mr. Theile meloxicam and metFORMIN. I am also having him start on linaclotide. Additionally, I am having him maintain his aspirin, amLODipine, losartan, cetirizine, glimepiride, RA CENTRAL-VITE MENS MATURE, and triamcinolone cream.  Meds ordered this encounter  Medications  . linaclotide (LINZESS) 290 MCG CAPS capsule    Sig: Take 1 capsule (290 mcg total) by mouth daily before breakfast.    Dispense:  90 capsule    Refill:  1     Follow-up: Return in about 3 weeks (around 03/03/2016).  Scarlette Calico, MD

## 2016-02-12 ENCOUNTER — Other Ambulatory Visit: Payer: Self-pay | Admitting: Internal Medicine

## 2016-02-12 ENCOUNTER — Encounter: Payer: Self-pay | Admitting: Internal Medicine

## 2016-02-12 DIAGNOSIS — M79651 Pain in right thigh: Secondary | ICD-10-CM

## 2016-02-12 NOTE — Telephone Encounter (Signed)
Pt wife called in and said that pt sugar is creeping up because he has come off his meds until he see the kidney Dr.  Johnney Ou do they need to do?

## 2016-02-13 ENCOUNTER — Other Ambulatory Visit: Payer: Self-pay | Admitting: Internal Medicine

## 2016-02-13 ENCOUNTER — Encounter: Payer: Self-pay | Admitting: Internal Medicine

## 2016-02-13 ENCOUNTER — Other Ambulatory Visit (INDEPENDENT_AMBULATORY_CARE_PROVIDER_SITE_OTHER): Payer: BLUE CROSS/BLUE SHIELD

## 2016-02-13 DIAGNOSIS — I10 Essential (primary) hypertension: Secondary | ICD-10-CM | POA: Diagnosis not present

## 2016-02-13 DIAGNOSIS — M79651 Pain in right thigh: Secondary | ICD-10-CM

## 2016-02-13 DIAGNOSIS — N179 Acute kidney failure, unspecified: Secondary | ICD-10-CM

## 2016-02-13 DIAGNOSIS — N189 Chronic kidney disease, unspecified: Secondary | ICD-10-CM | POA: Diagnosis not present

## 2016-02-13 LAB — URINALYSIS, ROUTINE W REFLEX MICROSCOPIC
Bilirubin Urine: NEGATIVE
HGB URINE DIPSTICK: NEGATIVE
Ketones, ur: NEGATIVE
LEUKOCYTES UA: NEGATIVE
NITRITE: NEGATIVE
Specific Gravity, Urine: 1.01 (ref 1.000–1.030)
Total Protein, Urine: NEGATIVE
Urine Glucose: NEGATIVE
Urobilinogen, UA: 0.2 (ref 0.0–1.0)
pH: 6 (ref 5.0–8.0)

## 2016-02-13 LAB — BASIC METABOLIC PANEL
BUN: 37 mg/dL — ABNORMAL HIGH (ref 6–23)
CO2: 24 meq/L (ref 19–32)
Calcium: 10.2 mg/dL (ref 8.4–10.5)
Chloride: 106 mEq/L (ref 96–112)
Creatinine, Ser: 2.51 mg/dL — ABNORMAL HIGH (ref 0.40–1.50)
GFR: 27.12 mL/min — ABNORMAL LOW (ref >60.00)
GLUCOSE: 131 mg/dL — AB (ref 70–99)
Potassium: 4.8 mEq/L (ref 3.5–5.1)
SODIUM: 138 meq/L (ref 135–145)

## 2016-02-13 MED ORDER — HYDROCODONE-ACETAMINOPHEN 5-325 MG PO TABS: 1.0000 | ORAL_TABLET | Freq: Four times a day (QID) | ORAL | 0 refills | Status: DC | PRN

## 2016-02-14 ENCOUNTER — Encounter: Payer: Self-pay | Admitting: Internal Medicine

## 2016-02-17 ENCOUNTER — Other Ambulatory Visit: Payer: Self-pay | Admitting: Internal Medicine

## 2016-02-17 ENCOUNTER — Encounter: Payer: Self-pay | Admitting: Internal Medicine

## 2016-02-17 DIAGNOSIS — N189 Chronic kidney disease, unspecified: Secondary | ICD-10-CM

## 2016-02-17 DIAGNOSIS — N179 Acute kidney failure, unspecified: Secondary | ICD-10-CM

## 2016-02-17 DIAGNOSIS — I10 Essential (primary) hypertension: Secondary | ICD-10-CM

## 2016-02-17 NOTE — Progress Notes (Signed)
[facility] [place], [place] [address] Phone: [phone] Subjective:    I'm seeing this patient by the request  of:  Scarlette Calico, MD   CC: Right thigh pain  [id number]  Dakota [facility] is a 71 y.o. male coming in with complaint of right thigh pain. Patient states that he has had a 2 week history of increasing pain in the anterior thigh. Patient is taking anti-inflammatories but unfortunately had elevation in creatinine. Patient had discontinued this but unfortunately continued to have pain. Past medical history is significant for colon and prostate cancer. Patient states that is unrelenting pain that seems to be constant and worsening especially with any type of weightbearing. Patient states that this could've occurred after slipping on ice approximately one month ago.   patient didn't have femur x-rays taken 02/11/2016. These were independently visualized by me. Mild arthritic changes of the right hip as well as mild knee arthritis. Patient does have changes of the peripheral vascular disease.  Past Medical History:  Diagnosis Date  . Arthritis   . Colon cancer ([place])   . Colon polyps    due for f/u colonoscopy march 2015; Dr Caleb Popp  . Degenerative joint disease 05/05/2010  . Diabetes mellitus   . Erectile dysfunction 05/05/2010  . Heart murmur   . Hypertension   . Type II or unspecified type diabetes mellitus without mention of complication, uncontrolled 05/05/2010   Past Surgical History:  Procedure Laterality Date  . colon surgury  01/19/2006  . COLONOSCOPY    . HERNIA REPAIR     RIGHT   Social History   Social History  . Marital status: Married    Spouse name: N/A  . Number of children: N/A  . Years of education: 69   Occupational History  . Truck Psychiatric nurse distance   Social History Main Topics  . Smoking status: Former Research scientist (life sciences)  . Smokeless tobacco: Never Used     Comment: quit 5 yrs. ago.  . Alcohol  use No  . Drug use: No  . Sexual activity: Yes   Other Topics Concern  . 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 Lisinopril Cough   Family History  Problem Relation Age of Onset  . Arthritis Mother   . Heart disease Mother   . Hypertension Mother   . Arthritis Father   . Heart disease Father   . Hypertension Father   . Diabetes Sister     Past medical history, social, surgical and family history all reviewed in electronic medical record.  No pertanent information unless stated regarding to the chief complaint.   Review of Systems:Review of systems updated and as accurate as of 02/18/16  No headache, visual changes, nausea, vomiting, diarrhea, constipation, dizziness, abdominal pain, skin rash, fevers, chills, night sweats, weight loss, swollen lymph nodes,, chest pain, shortness of breath, mood changes. Positive for muscle aches.  Objective  Blood pressure 134/72, pulse 85, height 5\' 10"  (1.778 m), weight 249 lb (112.9 kg), SpO2 97 %. Systems examined below as of 02/18/16   General: No apparent distress alert and oriented x3 mood and affect normal, dressed appropriately.  HEENT: Pupils equal, extraocular movements intact  Respiratory: Patient's speak in full sentences and does not appear short of breath  Cardiovascular: No lower extremity edema, non tender, no erythema  Skin: Warm dry intact with  no signs of infection or rash on extremities or on axial skeleton.  Abdomen: Soft nontender  Neuro: Cranial nerves II through XII are intact, neurovascularly intact in all extremities with 2+ DTRs and 2+ pulses.  Lymph: No lymphadenopathy of posterior or anterior cervical chain or axillae bilaterally.  Gait Severely antalgic gait MSK:  Non tender with full range of motion and good stability and symmetric strength and tone of shoulders, elbows, wrist, , knee and ankles bilaterally.  Back Exam:   Inspection: Unremarkable  Motion: Flexion 25 deg with severe pain in the anterior aspect of the thigh, Extension 45 deg, Side Bending to 25 deg bilaterally,  Rotation to 25 deg bilaterally  SLR laying: Positive right XSLR laying: Negative  Palpable tenderness: Minimal tenderness over the right anterior thigh.. FABER: negative. Patient actually has fairly good range of motion with internal and next to rotation of the hip. Sensory change: Gross sensation intact to all lumbar and sacral dermatomes.  Reflexes: 2+ at both patellar tendons, 2+ at achilles tendons, Babinski's downgoing.  Strength at foot  4 out of 5 in hip flexion compared to the contralateral side but otherwise unremarkable.     Impression and Recommendations:     This case required medical decision making of moderate complexity.      Note: This dictation was prepared with Dragon dictation along with smaller phrase technology. Any transcriptional errors that result from this process are unintentional.

## 2016-02-18 ENCOUNTER — Ambulatory Visit (INDEPENDENT_AMBULATORY_CARE_PROVIDER_SITE_OTHER): Payer: BLUE CROSS/BLUE SHIELD | Admitting: Family Medicine

## 2016-02-18 ENCOUNTER — Other Ambulatory Visit (INDEPENDENT_AMBULATORY_CARE_PROVIDER_SITE_OTHER): Payer: BLUE CROSS/BLUE SHIELD

## 2016-02-18 ENCOUNTER — Encounter: Payer: Self-pay | Admitting: Internal Medicine

## 2016-02-18 ENCOUNTER — Encounter: Payer: Self-pay | Admitting: Family Medicine

## 2016-02-18 ENCOUNTER — Ambulatory Visit (INDEPENDENT_AMBULATORY_CARE_PROVIDER_SITE_OTHER)
Admission: RE | Admit: 2016-02-18 | Discharge: 2016-02-18 | Disposition: A | Discharge status: Home / Self Care | Payer: BLUE CROSS/BLUE SHIELD | Source: Ambulatory Visit | Attending: Family Medicine | Admitting: Family Medicine

## 2016-02-18 VITALS — BP 134/72 | HR 85 | Ht 70.0 in | Wt 249.0 lb

## 2016-02-18 DIAGNOSIS — I10 Essential (primary) hypertension: Secondary | ICD-10-CM

## 2016-02-18 DIAGNOSIS — N189 Chronic kidney disease, unspecified: Secondary | ICD-10-CM

## 2016-02-18 DIAGNOSIS — N179 Acute kidney failure, unspecified: Secondary | ICD-10-CM

## 2016-02-18 DIAGNOSIS — M5416 Radiculopathy, lumbar region: Secondary | ICD-10-CM

## 2016-02-18 LAB — BASIC METABOLIC PANEL
BUN: 24 mg/dL — AB (ref 6–23)
CALCIUM: 10.1 mg/dL (ref 8.4–10.5)
CO2: 25 mEq/L (ref 19–32)
CREATININE: 1.38 mg/dL (ref 0.40–1.50)
Chloride: 104 mEq/L (ref 96–112)
GFR: 54.08 mL/min — ABNORMAL LOW (ref >60.00)
Glucose, Bld: 148 mg/dL — ABNORMAL HIGH (ref 70–99)
Potassium: 4.5 mEq/L (ref 3.5–5.1)
Sodium: 136 mEq/L (ref 135–145)

## 2016-02-18 MED ORDER — GABAPENTIN 100 MG PO CAPS: 200.0000 mg | ORAL_CAPSULE | Freq: Every day | ORAL | 3 refills | Status: DC

## 2016-02-18 MED ORDER — PREDNISONE 50 MG PO TABS
50.0000 mg | ORAL_TABLET | Freq: Every day | ORAL | 0 refills | Status: DC
Start: 2016-02-18 — End: 2016-03-03

## 2016-02-18 NOTE — Assessment & Plan Note (Signed)
With patient's findings today I think it is more secondary lumbar radiculopathy. Neck. Patient's lumbar spine ordered today. Patient has a positive straight leg test and as well as an antalgic gait that I think is secondary to the radicular symptoms. Started on prednisone and will monitor blood sugars. Patient also started on gabapentin for the radicular symptoms of the anterior thigh. Differential also includes with him having a potential injury avascular necrosis of the hip that would not be seen on x-ray but would potentially need an MRI. At this point patient is unable to work and we'll hold him out of work for the next week. We discussed icing regimen. Patient come back and see me again in the next 5-10 days for further evaluation. Worsening symptoms or any weakness advance imaging of the lumbar spine and potentially hip would be necessary.

## 2016-02-18 NOTE — Patient Instructions (Signed)
Good to see you  We will get back xray today downstairs.  Prednisone daily for 5 days, watch blood sugars.  Gabapentin 200mg  at night  Ice 20 minutes 2 times daily. Usually after activity and before bed. Try no heavy lifting until I see you again.  See me again in 7-10 days for sure.

## 2016-02-20 ENCOUNTER — Other Ambulatory Visit: Payer: Self-pay | Admitting: Internal Medicine

## 2016-02-20 ENCOUNTER — Encounter: Payer: Self-pay | Admitting: Internal Medicine

## 2016-02-20 DIAGNOSIS — E119 Type 2 diabetes mellitus without complications: Secondary | ICD-10-CM

## 2016-02-20 DIAGNOSIS — N179 Acute kidney failure, unspecified: Secondary | ICD-10-CM

## 2016-02-20 DIAGNOSIS — I10 Essential (primary) hypertension: Secondary | ICD-10-CM

## 2016-02-20 DIAGNOSIS — N182 Chronic kidney disease, stage 2 (mild): Secondary | ICD-10-CM

## 2016-02-22 NOTE — Progress Notes (Signed)
[facility] [place], [place] [address] Phone: [phone] Subjective:    I'm seeing this patient by the request  of:  Scarlette Calico, MD   CC: Right thigh pain f/u   [id number]  Dakota Abbott is a 71 y.o. male coming in with complaint of right thigh pain. Patient was having this for quite some time. Was seen by me and there is concern for more of a lumbar radiculopathy. Patient was given prednisone as well as gabapentin. Patient was to start increasing activity. Patient states Doing approximately 80% better. Patient states that the radicular symptoms is significantly decreased. Patient is artery started to increase activity. Patient states that there is no significant weakness. Resting more comfortably at night.   patient didn't have femur x-rays taken 02/11/2016. These were independently visualized by me. Mild arthritic changes of the right hip as well as mild knee arthritis. Patient does have changes of the peripheral vascular disease.patient did have x-rays of the low back though that did show moderate osteophytic changes.  Past Medical History:  Diagnosis Date  . Arthritis   . Colon cancer ([place])   . Colon polyps    due for f/u colonoscopy march 2015; Dr Caleb Popp  . Degenerative joint disease 05/05/2010  . Diabetes mellitus   . Erectile dysfunction 05/05/2010  . Heart murmur   . Hypertension   . Type II or unspecified type diabetes mellitus without mention of complication, uncontrolled 05/05/2010   Past Surgical History:  Procedure Laterality Date  . colon surgury  01/19/2006  . COLONOSCOPY    . HERNIA REPAIR     RIGHT   Social History   Social History  . Marital status: Married    Spouse name: N/A  . Number of children: N/A  . Years of education: 70   Occupational History  . Truck Psychiatric nurse distance   Social History Main Topics  . Smoking status: Former Research scientist (life sciences)  . Smokeless tobacco: Never Used     Comment:  quit 5 yrs. ago.  . Alcohol use No  . Drug use: No  . Sexual activity: Yes   Other Topics Concern  . 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 Lisinopril Cough   Family History  Problem Relation Age of Onset  . Arthritis Mother   . Heart disease Mother   . Hypertension Mother   . Arthritis Father   . Heart disease Father   . Hypertension Father   . Diabetes Sister     Past medical history, social, surgical and family history all reviewed in electronic medical record.  No pertanent information unless stated regarding to the chief complaint.   Review of Systems: No headache, visual changes, nausea, vomiting, diarrhea, constipation, dizziness, abdominal pain, skin rash, fevers, chills, night sweats, weight loss, swollen lymph nodes, body aches, joint swelling, chest pain, shortness of breath, mood changes.   Positive for muscle aches.  Objective  Blood pressure 128/70, pulse (!) 113, height 5\' 10"  (1.778 m), weight 246 lb (111.6 kg), SpO2 98 %.   Systems examined below as of 02/24/16 General: NAD A&O x3 mood, affect normal  HEENT: Pupils equal, extraocular movements intact no nystagmus Respiratory: not short of breath at rest or with speaking Cardiovascular: No lower extremity edema, non tender Skin: Warm dry intact with no signs of infection or rash on extremities or  on axial skeleton. Abdomen: Soft nontender, no masses obese.  Neuro: Cranial nerves  intact, neurovascularly intact in all extremities with 2+ DTRs and 2+ pulses. Lymph: No lymphadenopathy appreciated today  Gait normal with good balance and coordination.  MSK: Non tender with full range of motion and good stability and symmetric strength and tone of shoulders, elbows, wrist,  knee hips and ankles bilaterally.  Mild arthritic changes of multiple joints Back Exam:  Inspection: Unremarkable  Motion: Flexion 35 deg, Extension  25 deg, Side Bending to 35 deg bilaterally,  Rotation to 35 deg bilaterally  SLR laying: Negative tightness but significant improvement from previous exam XSLR laying: Negative  Palpable tenderness: Tender to palpation in the paraspinal musculature of the lumbar spine mostly on the right greater than left. FABER: negative. Sensory change: Gross sensation intact to all lumbar and sacral dermatomes.  Reflexes: 2+ at both patellar tendons, 2+ at achilles tendons, Babinski's downgoing.  Strength at foot  Improvement with near symmetric strength.  Procedure note [id number]; 15 minutes spent for Therapeutic exercises as stated in above notes.  This included exercises focusing on stretching, strengthening, with significant focus on eccentric aspects.  Low back exercises that included:  Pelvic tilt/bracing instruction to focus on control of the pelvic girdle and lower abdominal muscles  Glute strengthening exercises, focusing on proper firing of the glutes without engaging the low back muscles Proper stretching techniques for maximum relief for the hamstrings, hip flexors, low back and some rotation where tolerated   Proper technique shown and discussed handout in great detail with ATC.  All questions were discussed and answered.      Impression and Recommendations:     This case required medical decision making of moderate complexity.      Note: This dictation was prepared with Dragon dictation along with smaller phrase technology. Any transcriptional errors that result from this process are unintentional.

## 2016-02-24 ENCOUNTER — Ambulatory Visit (INDEPENDENT_AMBULATORY_CARE_PROVIDER_SITE_OTHER): Payer: BLUE CROSS/BLUE SHIELD | Admitting: Family Medicine

## 2016-02-24 ENCOUNTER — Encounter: Payer: Self-pay | Admitting: *Deleted

## 2016-02-24 ENCOUNTER — Encounter: Payer: Self-pay | Admitting: Internal Medicine

## 2016-02-24 ENCOUNTER — Encounter: Payer: Self-pay | Admitting: Family Medicine

## 2016-02-24 ENCOUNTER — Other Ambulatory Visit (INDEPENDENT_AMBULATORY_CARE_PROVIDER_SITE_OTHER): Payer: BLUE CROSS/BLUE SHIELD

## 2016-02-24 DIAGNOSIS — N179 Acute kidney failure, unspecified: Secondary | ICD-10-CM

## 2016-02-24 DIAGNOSIS — E119 Type 2 diabetes mellitus without complications: Secondary | ICD-10-CM

## 2016-02-24 DIAGNOSIS — M5416 Radiculopathy, lumbar region: Secondary | ICD-10-CM

## 2016-02-24 DIAGNOSIS — I10 Essential (primary) hypertension: Secondary | ICD-10-CM

## 2016-02-24 DIAGNOSIS — N182 Chronic kidney disease, stage 2 (mild): Secondary | ICD-10-CM | POA: Diagnosis not present

## 2016-02-24 LAB — COMPREHENSIVE METABOLIC PANEL WITH GFR
ALT: 35 U/L (ref 0–53)
AST: 29 U/L (ref 0–37)
Albumin: 4.5 g/dL (ref 3.5–5.2)
Alkaline Phosphatase: 32 U/L — ABNORMAL LOW (ref 39–117)
BUN: 32 mg/dL — ABNORMAL HIGH (ref 6–23)
CO2: 21 meq/L (ref 19–32)
Calcium: 9.9 mg/dL (ref 8.4–10.5)
Chloride: 103 meq/L (ref 96–112)
Creatinine, Ser: 1.5 mg/dL (ref 0.40–1.50)
GFR: 49.12 mL/min — ABNORMAL LOW
Glucose, Bld: 175 mg/dL — ABNORMAL HIGH (ref 70–99)
Potassium: 4.3 meq/L (ref 3.5–5.1)
Sodium: 136 meq/L (ref 135–145)
Total Bilirubin: 0.8 mg/dL (ref 0.2–1.2)
Total Protein: 7.4 g/dL (ref 6.0–8.3)

## 2016-02-24 LAB — HEMOGLOBIN A1C: HEMOGLOBIN A1C: 6.3 % (ref 4.6–6.5)

## 2016-02-24 NOTE — Patient Instructions (Signed)
Good to see you  Ice 20 minutes 2 times daily. Usually after activity and before bed. Exercises 3 times a week.  Continue the gabapentin at night See me again in 4-6 weeks if not perfect

## 2016-02-24 NOTE — Assessment & Plan Note (Signed)
Significant improvement at this time. Patient will continue on the gabapentin. Patient work with Product/process development scientist to learn home exercises in greater detail. We discussed icing regimen. Which activities doing which ones to avoid. Follow-up again in 6-8 weeks.

## 2016-03-03 ENCOUNTER — Encounter: Payer: Self-pay | Admitting: Family Medicine

## 2016-03-03 MED ORDER — PREDNISONE 50 MG PO TABS: 50.0000 mg | ORAL_TABLET | Freq: Every day | ORAL | 0 refills | Status: DC

## 2016-03-04 ENCOUNTER — Encounter: Payer: Self-pay | Admitting: Family Medicine

## 2016-03-04 MED ORDER — PREDNISONE 50 MG PO TABS: 50.0000 mg | ORAL_TABLET | Freq: Every day | ORAL | 0 refills | Status: DC

## 2016-03-06 ENCOUNTER — Encounter: Payer: Self-pay | Admitting: Family Medicine

## 2016-03-09 ENCOUNTER — Encounter: Payer: Self-pay | Admitting: Internal Medicine

## 2016-03-09 ENCOUNTER — Encounter: Payer: PRIVATE HEALTH INSURANCE | Admitting: Internal Medicine

## 2016-03-09 ENCOUNTER — Other Ambulatory Visit: Payer: Self-pay | Admitting: *Deleted

## 2016-03-09 MED ORDER — GABAPENTIN 100 MG PO CAPS: 200.0000 mg | ORAL_CAPSULE | Freq: Every day | ORAL | 1 refills | Status: DC

## 2016-03-09 NOTE — Telephone Encounter (Signed)
Refill done.  

## 2016-03-11 ENCOUNTER — Other Ambulatory Visit: Payer: Self-pay | Admitting: *Deleted

## 2016-03-11 MED ORDER — GABAPENTIN 100 MG PO CAPS: 200.0000 mg | ORAL_CAPSULE | Freq: Every day | ORAL | 1 refills | Status: DC

## 2016-03-11 NOTE — Telephone Encounter (Signed)
Resent to correct pharmacy.

## 2016-04-06 ENCOUNTER — Ambulatory Visit: Payer: BLUE CROSS/BLUE SHIELD | Admitting: Family Medicine

## 2016-04-08 ENCOUNTER — Other Ambulatory Visit: Payer: Self-pay | Admitting: Family Medicine

## 2016-04-08 ENCOUNTER — Other Ambulatory Visit: Payer: Self-pay | Admitting: Internal Medicine

## 2016-04-23 ENCOUNTER — Other Ambulatory Visit: Payer: Self-pay | Admitting: Internal Medicine

## 2016-04-23 DIAGNOSIS — Z0000 Encounter for general adult medical examination without abnormal findings: Secondary | ICD-10-CM

## 2016-05-20 LAB — HM DIABETES EYE EXAM

## 2016-07-13 ENCOUNTER — Encounter: Payer: BLUE CROSS/BLUE SHIELD | Admitting: Internal Medicine

## 2016-07-20 ENCOUNTER — Encounter: Payer: BLUE CROSS/BLUE SHIELD | Admitting: Internal Medicine

## 2016-07-26 ENCOUNTER — Telehealth: Payer: BLUE CROSS/BLUE SHIELD | Admitting: Family

## 2016-07-26 DIAGNOSIS — B349 Viral infection, unspecified: Secondary | ICD-10-CM

## 2016-07-26 DIAGNOSIS — J029 Acute pharyngitis, unspecified: Secondary | ICD-10-CM

## 2016-07-26 MED ORDER — FLUTICASONE PROPIONATE 50 MCG/ACT NA SUSP: 2.0000 | Freq: Every day | NASAL | 6 refills | Status: DC

## 2016-07-26 NOTE — Progress Notes (Signed)
We are sorry that you are not feeling well.  Here is how we plan to help!  Based on what you have shared with me it looks like you have sinusitis.  Sinusitis is inflammation and infection in the sinus cavities of the head.  Based on your presentation I believe you most likely have Acute Viral Sinusitis.This is an infection most likely caused by a virus. There is not specific treatment for viral sinusitis other than to help you with the symptoms until the infection runs its course.  You may use an oral decongestant such as Mucinex D or if you have glaucoma or high blood pressure use plain Mucinex. Saline nasal spray help and can safely be used as often as needed for congestion, I have prescribed: Fluticasone nasal spray two sprays in each nostril twice a day   Providers prescribe antibiotics to treat infections caused by bacteria. Antibiotics are very powerful in treating bacterial infections when they are used properly. To maintain their effectiveness, they should be used only when necessary. Overuse of antibiotics has resulted in the development of superbugs that are resistant to treatment!    After careful review of your answers, I would not recommend an antibiotic for your condition.  Antibiotics are not effective against viruses and therefore should not be used to treat them. Common examples of infections caused by viruses include colds and flu   Some authorities believe that zinc sprays or the use of Echinacea may shorten the course of your symptoms.  Sinus infections are not as easily transmitted as other respiratory infection, however we still recommend that you avoid close contact with loved ones, especially the very young and elderly.  Remember to wash your hands thoroughly throughout the day as this is the number one way to prevent the spread of infection!  Home Care:  Only take medications as instructed by your medical team.  Complete the entire course of an antibiotic.  Do not take  these medications with alcohol.  A steam or ultrasonic humidifier can help congestion.  You can place a towel over your head and breathe in the steam from hot water coming from a faucet.  Avoid close contacts especially the very young and the elderly.  Cover your mouth when you cough or sneeze.  Always remember to wash your hands.  Get Help Right Away If:  You develop worsening fever or sinus pain.  You develop a severe head ache or visual changes.  Your symptoms persist after you have completed your treatment plan.  Make sure you  Understand these instructions.  Will watch your condition.  Will get help right away if you are not doing well or get worse.  Your e-visit answers were reviewed by a board certified advanced clinical practitioner to complete your personal care plan.  Depending on the condition, your plan could have included both over the counter or prescription medications.  If there is a problem please reply  once you have received a response from your provider.  Your safety is important to us.  If you have drug allergies check your prescription carefully.    You can use MyChart to ask questions about today's visit, request a non-urgent call back, or ask for a work or school excuse for 24 hours related to this e-Visit. If it has been greater than 24 hours you will need to follow up with your provider, or enter a new e-Visit to address those concerns.  You will get an e-mail in the next two   days asking about your experience.  I hope that your e-visit has been valuable and will speed your recovery. Thank you for using e-visits.    

## 2016-07-29 MED ORDER — FLUTICASONE PROPIONATE 50 MCG/ACT NA SUSP
2.0000 | Freq: Every day | NASAL | 6 refills | Status: DC
Start: 2016-07-29 — End: 2016-08-03

## 2016-07-29 NOTE — Addendum Note (Signed)
Addended by: Evelina Dun A on: 07/29/2016 09:54 PM   Modules accepted: Orders

## 2016-08-02 ENCOUNTER — Other Ambulatory Visit: Payer: Self-pay | Admitting: Internal Medicine

## 2016-08-02 DIAGNOSIS — Z0000 Encounter for general adult medical examination without abnormal findings: Secondary | ICD-10-CM

## 2016-08-03 ENCOUNTER — Encounter: Payer: Self-pay | Admitting: Internal Medicine

## 2016-08-03 ENCOUNTER — Ambulatory Visit (INDEPENDENT_AMBULATORY_CARE_PROVIDER_SITE_OTHER): Payer: BLUE CROSS/BLUE SHIELD | Admitting: Internal Medicine

## 2016-08-03 ENCOUNTER — Other Ambulatory Visit (INDEPENDENT_AMBULATORY_CARE_PROVIDER_SITE_OTHER): Payer: BLUE CROSS/BLUE SHIELD

## 2016-08-03 VITALS — BP 140/80 | HR 86 | Temp 98.1°F | Resp 16 | Ht 70.0 in | Wt 256.0 lb

## 2016-08-03 DIAGNOSIS — I10 Essential (primary) hypertension: Secondary | ICD-10-CM | POA: Diagnosis not present

## 2016-08-03 DIAGNOSIS — E785 Hyperlipidemia, unspecified: Secondary | ICD-10-CM | POA: Insufficient documentation

## 2016-08-03 DIAGNOSIS — K5904 Chronic idiopathic constipation: Secondary | ICD-10-CM | POA: Diagnosis not present

## 2016-08-03 DIAGNOSIS — J301 Allergic rhinitis due to pollen: Secondary | ICD-10-CM | POA: Diagnosis not present

## 2016-08-03 DIAGNOSIS — Z0000 Encounter for general adult medical examination without abnormal findings: Secondary | ICD-10-CM

## 2016-08-03 DIAGNOSIS — N182 Chronic kidney disease, stage 2 (mild): Secondary | ICD-10-CM | POA: Diagnosis not present

## 2016-08-03 DIAGNOSIS — R011 Cardiac murmur, unspecified: Secondary | ICD-10-CM

## 2016-08-03 DIAGNOSIS — E781 Pure hyperglyceridemia: Secondary | ICD-10-CM | POA: Insufficient documentation

## 2016-08-03 DIAGNOSIS — Z0001 Encounter for general adult medical examination with abnormal findings: Secondary | ICD-10-CM | POA: Diagnosis not present

## 2016-08-03 DIAGNOSIS — R9431 Abnormal electrocardiogram [ECG] [EKG]: Secondary | ICD-10-CM

## 2016-08-03 DIAGNOSIS — E119 Type 2 diabetes mellitus without complications: Secondary | ICD-10-CM

## 2016-08-03 LAB — CBC WITH DIFFERENTIAL/PLATELET
Basophils Absolute: 0.1 10*3/uL (ref 0.0–0.1)
Basophils Relative: 1.1 % (ref 0.0–3.0)
Eosinophils Absolute: 0.3 10*3/uL (ref 0.0–0.7)
Eosinophils Relative: 4.3 % (ref 0.0–5.0)
HEMATOCRIT: 43 % (ref 39.0–52.0)
HEMOGLOBIN: 15.4 g/dL (ref 13.0–17.0)
LYMPHS PCT: 33.7 % (ref 12.0–46.0)
Lymphs Abs: 2.5 10*3/uL (ref 0.7–4.0)
MCHC: 35.8 g/dL (ref 30.0–36.0)
MCV: 90.3 fl (ref 78.0–100.0)
MONOS PCT: 7.6 % (ref 3.0–12.0)
Monocytes Absolute: 0.6 10*3/uL (ref 0.1–1.0)
Neutro Abs: 4 10*3/uL (ref 1.4–7.7)
Neutrophils Relative %: 53.3 % (ref 43.0–77.0)
Platelets: 199 10*3/uL (ref 150.0–400.0)
RBC: 4.76 Mil/uL (ref 4.22–5.81)
RDW: 13 % (ref 11.5–15.5)
WBC: 7.4 10*3/uL (ref 4.0–10.5)

## 2016-08-03 LAB — URINALYSIS, ROUTINE W REFLEX MICROSCOPIC
BILIRUBIN URINE: NEGATIVE
HGB URINE DIPSTICK: NEGATIVE
KETONES UR: NEGATIVE
LEUKOCYTES UA: NEGATIVE
NITRITE: NEGATIVE
RBC / HPF: NONE SEEN (ref 0–?)
Specific Gravity, Urine: 1.02 (ref 1.000–1.030)
TOTAL PROTEIN, URINE-UPE24: NEGATIVE
UROBILINOGEN UA: 0.2 (ref 0.0–1.0)
Urine Glucose: >=1000 — AB
pH: 6 (ref 5.0–8.0)

## 2016-08-03 LAB — COMPREHENSIVE METABOLIC PANEL
ALK PHOS: 39 U/L (ref 39–117)
ALT: 52 U/L (ref 0–53)
AST: 39 U/L — ABNORMAL HIGH (ref 0–37)
Albumin: 4.5 g/dL (ref 3.5–5.2)
BILIRUBIN TOTAL: 0.8 mg/dL (ref 0.2–1.2)
BUN: 22 mg/dL (ref 6–23)
CALCIUM: 10 mg/dL (ref 8.4–10.5)
CO2: 21 mEq/L (ref 19–32)
Chloride: 106 mEq/L (ref 96–112)
Creatinine, Ser: 1.1 mg/dL (ref 0.40–1.50)
GFR: 70.17 mL/min (ref >60.00)
Glucose, Bld: 150 mg/dL — ABNORMAL HIGH (ref 70–99)
Potassium: 4.3 mEq/L (ref 3.5–5.1)
Sodium: 136 mEq/L (ref 135–145)
TOTAL PROTEIN: 7.5 g/dL (ref 6.0–8.3)

## 2016-08-03 LAB — LIPID PANEL
Cholesterol: 141 mg/dL (ref 0–200)
HDL: 33.5 mg/dL — AB (ref >39.00)
NonHDL: 107.55
TRIGLYCERIDES: 314 mg/dL — AB (ref 0.0–149.0)
Total CHOL/HDL Ratio: 4
VLDL: 62.8 mg/dL — ABNORMAL HIGH (ref 0.0–40.0)

## 2016-08-03 LAB — MICROALBUMIN / CREATININE URINE RATIO
Creatinine,U: 149.7 mg/dL
Microalb Creat Ratio: 0.9 mg/g (ref 0.0–30.0)
Microalb, Ur: 1.4 mg/dL (ref 0.0–1.9)

## 2016-08-03 LAB — LDL CHOLESTEROL, DIRECT: Direct LDL: 65 mg/dL

## 2016-08-03 LAB — HEMOGLOBIN A1C: HEMOGLOBIN A1C: 7 % — AB (ref 4.6–6.5)

## 2016-08-03 MED ORDER — LUBIPROSTONE 24 MCG PO CAPS: 24.0000 ug | ORAL_CAPSULE | Freq: Two times a day (BID) | ORAL | 1 refills | Status: DC

## 2016-08-03 MED ORDER — FLUTICASONE PROPIONATE 50 MCG/ACT NA SUSP: 2.0000 | Freq: Every day | NASAL | 11 refills | Status: DC

## 2016-08-03 MED ORDER — ATORVASTATIN CALCIUM 20 MG PO TABS: 20.0000 mg | ORAL_TABLET | Freq: Every day | ORAL | 3 refills | Status: DC

## 2016-08-03 NOTE — Progress Notes (Signed)
Subjective:  Patient ID: Dakota Abbott, male    DOB: Sep 12, 1945  Age: 71 y.o. MRN: [id number]  CC: Annual Exam; Hypertension; Hyperlipidemia; and Diabetes   HPI Dakota Abbott presents for a CPX.  He complains of chronic constipation stating he has a bowel movement about every 3 days. The constipation causes bloating. He has tried lens asked with no improvements in the symptoms. He's had no recent episodes of abdominal pain, diarrhea, bloody stool, or weight loss. He does complain of weight gain. He tells me his blood sugars and blood pressure have been well controlled. He said no recent episodes of DOE, CP, palpitations, edema, or fatigue.  Outpatient Medications Prior to Visit  Medication Sig Dispense Refill  . cetirizine (ZYRTEC) 10 MG tablet Take 1 tablet (10 mg total) by mouth daily. 30 tablet 11  . gabapentin (NEURONTIN) 100 MG capsule Take 2 capsules (200 mg total) by mouth at bedtime. 180 capsule 1  . glimepiride (AMARYL) 4 MG tablet Take 1 tablet (4 mg total) by mouth daily. 90 tablet 2  . losartan (COZAAR) 100 MG tablet Take 1 tablet (100 mg total) by mouth daily. 90 tablet 3  . amLODipine (NORVASC) 5 MG tablet TAKE 1 TABLET BY MOUTH DAILY 90 tablet 3  . aspirin 81 MG EC tablet TAKE 1 TABLET DAILY (SWALLOW WHOLE) 30 tablet 2  . fluticasone (FLONASE) 50 MCG/ACT nasal spray Place 2 sprays into both nostrils daily. 16 g 6  . Multiple Vitamins-Minerals (RA CENTRAL-VITE MENS MATURE) TABS Take 1 tablet by mouth daily. 100 tablet 11  . HYDROcodone-acetaminophen (NORCO/VICODIN) 5-325 MG tablet Take 1 tablet by mouth every 6 (six) hours as needed for moderate pain. 30 tablet 0  . linaclotide (LINZESS) 290 MCG CAPS capsule Take 1 capsule (290 mcg total) by mouth daily before breakfast. 90 capsule 1  . predniSONE (DELTASONE) 50 MG tablet Take 1 tablet (50 mg total) by mouth daily. 5 tablet 0  . triamcinolone cream (KENALOG) 0.1 % Apply topically 2 (two) times daily. 80 g 1  . triamcinolone  cream (KENALOG) 0.1 % APPLY TOPICALLY TWICE A DAY 80 g 1   No facility-administered medications prior to visit.     ROS Review of Systems  Constitutional: Positive for unexpected weight change. Negative for activity change, appetite change, chills, diaphoresis and fatigue.  HENT: Positive for congestion, postnasal drip and rhinorrhea. Negative for sinus pain, sinus pressure, sneezing and trouble swallowing.   Eyes: Negative.  Negative for visual disturbance.  Respiratory: Negative.  Negative for apnea, cough, chest tightness, shortness of breath, wheezing and stridor.   Cardiovascular: Negative for chest pain, palpitations and leg swelling.  Gastrointestinal: Positive for constipation. Negative for abdominal distention, abdominal pain, anal bleeding, blood in stool, diarrhea, nausea, rectal pain and vomiting.  Endocrine: Negative.   Genitourinary: Negative.  Negative for difficulty urinating, dysuria, hematuria, penile swelling, scrotal swelling, testicular pain and urgency.  Musculoskeletal: Negative.  Negative for back pain and myalgias.  Skin: Negative.  Negative for color change and rash.  Allergic/Immunologic: Negative.   Neurological: Negative.   Hematological: Negative for adenopathy. Does not bruise/bleed easily.  Psychiatric/Behavioral: Negative.     Objective:  BP 140/80 (BP Location: Left Arm, Patient Position: Sitting, Cuff Size: Normal)   Pulse 86   Temp 98.1 F (36.7 C) (Oral)   Resp 16   Ht 5\' 10"  (1.778 m)   Wt 256 lb (116.1 kg)   SpO2 99%   BMI 36.73 kg/m   BP Readings from Last  3 Encounters:  08/03/16 140/80  02/24/16 128/70  02/18/16 134/72    Wt Readings from Last 3 Encounters:  08/03/16 256 lb (116.1 kg)  02/24/16 246 lb (111.6 kg)  02/18/16 249 lb (112.9 kg)    Physical Exam  Constitutional: No distress.  HENT:  Mouth/Throat: Oropharynx is clear and moist. No oropharyngeal exudate.  Eyes: Conjunctivae are normal. Right eye exhibits no  discharge. Left eye exhibits no discharge. No scleral icterus.  Neck: Normal range of motion. Neck supple. No JVD present. No thyromegaly present.  Cardiovascular: Normal rate, regular rhythm, S1 normal, S2 normal and intact distal pulses.  Exam reveals no gallop and no friction rub.   Murmur heard.  Systolic murmur is present with a grade of 2/6   No diastolic murmur is present  2/6 SEM heard everywhere  EKG- Sinus  Rhythm  -Poor R-wave progression -nonspecific -consider old anterior infarct.   -  Nonspecific T-abnormality.   ABNORMAL - no change from prior EKG   Pulmonary/Chest: Effort normal and breath sounds normal. No respiratory distress. He has no wheezes. He has no rales. He exhibits no tenderness.  Abdominal: Soft. Bowel sounds are normal. He exhibits no distension and no mass. There is no tenderness. There is no rebound and no guarding. Hernia confirmed negative in the right inguinal area and confirmed negative in the left inguinal area.  Genitourinary: Rectum normal, prostate normal, testes normal and penis normal. Rectal exam shows no external hemorrhoid, no internal hemorrhoid, no fissure, no mass, no tenderness, anal tone normal and guaiac negative stool. Prostate is not enlarged and not tender. Right testis shows no mass, no swelling and no tenderness. Right testis is descended. Left testis shows no mass, no swelling and no tenderness. Left testis is descended. Circumcised. No penile erythema or penile tenderness. No discharge found.  Lymphadenopathy:    He has no cervical adenopathy.       Right: No inguinal adenopathy present.       Left: No inguinal adenopathy present.  Skin: He is not diaphoretic.  Vitals reviewed.   Lab Results  Component Value Date   WBC 7.4 08/03/2016   HGB 15.4 08/03/2016   HCT 43.0 08/03/2016   PLT 199.0 08/03/2016   GLUCOSE 150 (H) 08/03/2016   CHOL 141 08/03/2016   TRIG 314.0 (H) 08/03/2016   HDL 33.50 (L) 08/03/2016   LDLDIRECT 65.0  08/03/2016   LDLCALC 67 01/30/2013   ALT 52 08/03/2016   AST 39 (H) 08/03/2016   NA 136 08/03/2016   K 4.3 08/03/2016   CL 106 08/03/2016   CREATININE 1.10 08/03/2016   BUN 22 08/03/2016   CO2 21 08/03/2016   TSH 0.95 02/11/2016   PSA 1.34 12/07/2011   HGBA1C 7.0 (H) 08/03/2016   MICROALBUR 1.4 08/03/2016    Dg Lumbar Spine Complete  Result Date: 02/18/2016 CLINICAL DATA:  Low back pain, right leg pain for 2 weeks EXAM: LUMBAR SPINE - COMPLETE 4+ VIEW COMPARISON:  02/11/2016 FINDINGS: Five views of the lumbar spine submitted. No acute fracture or subluxation. There is disc space flattening with vacuum disc phenomenon at T12-L1 and L1-L2 level. Mild anterior spurring upper and lower endplate of L1 and upper endplate of L2 vertebral body. Mild disc space flattening at L5-S1 level. Mild facet degenerative changes L5 level. IMPRESSION: No acute fracture or subluxation. Degenerative changes as described above. Electronically Signed   By: Lahoma Crocker M.D.   On: 02/18/2016 15:27    Assessment & Plan:  Argie was seen today for annual exam, hypertension, hyperlipidemia and diabetes.  Diagnoses and all orders for this visit:  Essential hypertension- his blood pressure is well-controlled, electrolytes and renal function are normal. -     CBC with Differential/Platelet; Future -     Urinalysis, Routine w reflex microscopic; Future  Controlled type 2 diabetes mellitus without complication, without long-term current use of insulin ([place])- his A1c is at 7%. His blood sugars are adequately well controlled. -     Comprehensive metabolic panel; Future -     Hemoglobin A1c; Future -     Microalbumin / creatinine urine ratio; Future  Chronic renal impairment, stage 2 (mild)- his renal function is normal now. He will avoid nephrotoxic agents. -     Comprehensive metabolic panel; Future  Hyperlipidemia LDL goal <100- his Framingham risk score is elevated 25% so I've asked him to start taking a statin  for CV risk reduction. -     Comprehensive metabolic panel; Future -     Lipid panel; Future -     atorvastatin (LIPITOR) 20 MG tablet; Take 1 tablet (20 mg total) by mouth daily.  Pure hyperglyceridemia- this is not high enough to warrant medical therapy but he was encouraged to improve his lifestyle modifications. -     Lipid panel; Future  Chronic idiopathic constipation- since he suffers from chronic constipation will stop the CCB to see if this helps relieve the symptoms. Also, will change Linzess to Amitiza to see if he gets better symptom relief. -     lubiprostone (AMITIZA) 24 MCG capsule; Take 1 capsule (24 mcg total) by mouth 2 (two) times daily with a meal.  Seasonal allergic rhinitis due to pollen -     fluticasone (FLONASE) 50 MCG/ACT nasal spray; Place 2 sprays into both nostrils daily.  Murmur- he has had a murmur his entire life but he tells me is never been investigated. He has no symptoms related to this and therefore does not need further evaluation at this time. -     EKG 12-Lead  Abnormal electrocardiogram (ECG) (EKG)- he has no symptoms of ischemia but his EKG shows concern for an old inferior infarct and he is high risk. I've asked him to undergo a myocardial perfusion imaging to screen for CAD. -     Myocardial Perfusion Imaging; Future   I have discontinued Mr. No amLODipine, RA CENTRAL-VITE MENS MATURE, triamcinolone cream, linaclotide, HYDROcodone-acetaminophen, predniSONE, and triamcinolone cream. I am also having him start on lubiprostone and atorvastatin. Additionally, I am having him maintain his losartan, cetirizine, glimepiride, gabapentin, and fluticasone.  Meds ordered this encounter  Medications  . fluticasone (FLONASE) 50 MCG/ACT nasal spray    Sig: Place 2 sprays into both nostrils daily.    Dispense:  16 g    Refill:  11    Ok to use OTC  . lubiprostone (AMITIZA) 24 MCG capsule    Sig: Take 1 capsule (24 mcg total) by mouth 2 (two) times  daily with a meal.    Dispense:  180 capsule    Refill:  1  . atorvastatin (LIPITOR) 20 MG tablet    Sig: Take 1 tablet (20 mg total) by mouth daily.    Dispense:  90 tablet    Refill:  3     Follow-up: Return in about 6 months (around 02/03/2017).  Scarlette Calico, MD

## 2016-08-03 NOTE — Patient Instructions (Signed)

## 2016-08-05 ENCOUNTER — Telehealth (HOSPITAL_COMMUNITY): Payer: Self-pay | Admitting: Internal Medicine

## 2016-08-11 NOTE — Telephone Encounter (Signed)
I called patient and spoke with his wife and she voiced that she will contact their insurance company to check the coverage. She voiced that she will give me a call back once she is able to verify that information.

## 2016-08-19 ENCOUNTER — Encounter: Payer: Self-pay | Admitting: Internal Medicine

## 2016-08-19 NOTE — Telephone Encounter (Signed)
Mailed ltr to pt to call cardiology to schedule stress test

## 2016-08-25 ENCOUNTER — Other Ambulatory Visit: Payer: Self-pay | Admitting: Internal Medicine

## 2016-08-27 ENCOUNTER — Other Ambulatory Visit: Payer: Self-pay | Admitting: Family Medicine

## 2016-08-27 NOTE — Telephone Encounter (Signed)
Refill done.  

## 2016-09-14 ENCOUNTER — Other Ambulatory Visit: Payer: Self-pay | Admitting: Internal Medicine

## 2016-09-14 DIAGNOSIS — E785 Hyperlipidemia, unspecified: Secondary | ICD-10-CM

## 2016-09-14 DIAGNOSIS — I10 Essential (primary) hypertension: Secondary | ICD-10-CM

## 2016-09-14 DIAGNOSIS — E119 Type 2 diabetes mellitus without complications: Secondary | ICD-10-CM

## 2016-09-14 DIAGNOSIS — Z0000 Encounter for general adult medical examination without abnormal findings: Secondary | ICD-10-CM

## 2016-09-14 MED ORDER — ATORVASTATIN CALCIUM 20 MG PO TABS: 20.0000 mg | ORAL_TABLET | Freq: Every day | ORAL | 1 refills | Status: DC

## 2016-09-14 MED ORDER — LOSARTAN POTASSIUM 100 MG PO TABS: 100.0000 mg | ORAL_TABLET | Freq: Every day | ORAL | 1 refills | Status: DC

## 2016-09-14 MED ORDER — GLIMEPIRIDE 4 MG PO TABS: 4.0000 mg | ORAL_TABLET | Freq: Every day | ORAL | 1 refills | Status: DC

## 2016-09-19 ENCOUNTER — Other Ambulatory Visit: Payer: Self-pay | Admitting: Family

## 2016-09-19 DIAGNOSIS — Z0000 Encounter for general adult medical examination without abnormal findings: Secondary | ICD-10-CM

## 2016-11-07 ENCOUNTER — Other Ambulatory Visit: Payer: Self-pay | Admitting: Family Medicine

## 2016-11-09 NOTE — Telephone Encounter (Signed)
Refill done.  

## 2016-11-22 ENCOUNTER — Encounter: Payer: Self-pay | Admitting: Internal Medicine

## 2016-11-22 DIAGNOSIS — Z0000 Encounter for general adult medical examination without abnormal findings: Secondary | ICD-10-CM

## 2016-11-23 MED ORDER — THERA VITAL M PO TABS: 1.0000 | ORAL_TABLET | Freq: Every day | ORAL | 1 refills | Status: DC

## 2016-11-23 MED ORDER — ASPIRIN 81 MG PO TBEC: 81.0000 mg | DELAYED_RELEASE_TABLET | Freq: Every day | ORAL | 1 refills | Status: DC

## 2017-01-11 ENCOUNTER — Encounter: Payer: Self-pay | Admitting: Internal Medicine

## 2017-01-18 ENCOUNTER — Encounter (HOSPITAL_COMMUNITY): Payer: BLUE CROSS/BLUE SHIELD

## 2017-01-19 ENCOUNTER — Encounter (HOSPITAL_COMMUNITY): Payer: BLUE CROSS/BLUE SHIELD

## 2017-01-21 ENCOUNTER — Encounter: Payer: Self-pay | Admitting: Internal Medicine

## 2017-01-21 MED ORDER — POLYETHYLENE GLYCOL 3350 17 GM/SCOOP PO POWD: 17.0000 g | Freq: Two times a day (BID) | ORAL | 1 refills | Status: DC | PRN

## 2017-02-05 ENCOUNTER — Encounter: Payer: Self-pay | Admitting: Internal Medicine

## 2017-02-05 DIAGNOSIS — E785 Hyperlipidemia, unspecified: Secondary | ICD-10-CM

## 2017-02-05 DIAGNOSIS — E119 Type 2 diabetes mellitus without complications: Secondary | ICD-10-CM

## 2017-02-05 DIAGNOSIS — I10 Essential (primary) hypertension: Secondary | ICD-10-CM

## 2017-02-10 MED ORDER — LOSARTAN POTASSIUM 100 MG PO TABS: 100.0000 mg | ORAL_TABLET | Freq: Every day | ORAL | 0 refills | Status: DC

## 2017-02-10 MED ORDER — ATORVASTATIN CALCIUM 20 MG PO TABS
20.0000 mg | ORAL_TABLET | Freq: Every day | ORAL | 0 refills | Status: DC
Start: 2017-02-10 — End: 2017-05-05

## 2017-02-10 MED ORDER — GABAPENTIN 100 MG PO CAPS: 200.0000 mg | ORAL_CAPSULE | Freq: Every day | ORAL | 0 refills | Status: DC

## 2017-02-10 MED ORDER — GLIMEPIRIDE 4 MG PO TABS: 4.0000 mg | ORAL_TABLET | Freq: Every day | ORAL | 0 refills | Status: DC

## 2017-02-16 ENCOUNTER — Telehealth: Payer: Self-pay

## 2017-02-16 NOTE — Telephone Encounter (Signed)
[employer] sent request True metrix meter and test trips, and trueplus ultra thin 30G Lancets.

## 2017-02-17 MED ORDER — BLOOD GLUCOSE MONITOR KIT: PACK | 0 refills | Status: AC

## 2017-02-17 MED ORDER — TRUEPLUS LANCETS 30G MISC: 3 refills | Status: AC

## 2017-02-17 MED ORDER — GLUCOSE BLOOD VI STRP: ORAL_STRIP | 3 refills | Status: AC

## 2017-02-23 ENCOUNTER — Encounter: Payer: Self-pay | Admitting: Internal Medicine

## 2017-02-23 ENCOUNTER — Telehealth (HOSPITAL_COMMUNITY): Payer: Self-pay | Admitting: *Deleted

## 2017-02-23 NOTE — Telephone Encounter (Signed)
Attempted to call patient regarding upcoming appointment- no answer.  Dakota Abbott Jacqueline  

## 2017-02-24 ENCOUNTER — Encounter: Payer: Self-pay | Admitting: Internal Medicine

## 2017-03-01 ENCOUNTER — Ambulatory Visit (HOSPITAL_COMMUNITY): Payer: Medicare HMO | Attending: Cardiology

## 2017-03-01 ENCOUNTER — Ambulatory Visit: Payer: Self-pay | Admitting: Internal Medicine

## 2017-03-01 DIAGNOSIS — R9431 Abnormal electrocardiogram [ECG] [EKG]: Secondary | ICD-10-CM

## 2017-03-01 LAB — MYOCARDIAL PERFUSION IMAGING
CHL CUP RESTING HR STRESS: 62 {beats}/min
CSEPPHR: 96 {beats}/min
LV sys vol: 88 mL
LVDIAVOL: 168 mL (ref 62–150)
RATE: 0.28
SDS: 5
SRS: 2
SSS: 7
TID: 1.03

## 2017-03-01 MED ORDER — TECHNETIUM TC 99M TETROFOSMIN IV KIT
31.4000 | PACK | Freq: Once | INTRAVENOUS | Status: AC | PRN
  Administered 2017-03-01: 31.4 via INTRAVENOUS
  Filled 2017-03-01: qty 32

## 2017-03-01 MED ORDER — REGADENOSON 0.4 MG/5ML IV SOLN
0.4000 mg | Freq: Once | INTRAVENOUS | Status: AC
Start: 2017-03-01 — End: 2017-03-01
  Administered 2017-03-01: 0.4 mg via INTRAVENOUS

## 2017-03-01 MED ORDER — TECHNETIUM TC 99M TETROFOSMIN IV KIT
11.0000 | PACK | Freq: Once | INTRAVENOUS | Status: AC | PRN
  Administered 2017-03-01: 11 via INTRAVENOUS
  Filled 2017-03-01: qty 11

## 2017-03-03 ENCOUNTER — Encounter: Payer: Self-pay | Admitting: Internal Medicine

## 2017-03-16 ENCOUNTER — Telehealth: Payer: Self-pay

## 2017-03-16 DIAGNOSIS — E119 Type 2 diabetes mellitus without complications: Secondary | ICD-10-CM

## 2017-03-16 MED ORDER — GLIMEPIRIDE 4 MG PO TABS: 4.0000 mg | ORAL_TABLET | Freq: Every day | ORAL | 0 refills | Status: DC

## 2017-03-16 NOTE — Telephone Encounter (Signed)
Patient is scheduled for a physical in April.

## 2017-03-16 NOTE — Telephone Encounter (Signed)
Request from [facility] to fill glimepiride tablets.  Pt needs an appt scheduled before I can send in any refills.

## 2017-03-16 NOTE — Telephone Encounter (Signed)
erx for 90 day supply with no refills sent to [facility] per faxed request.

## 2017-03-29 ENCOUNTER — Ambulatory Visit: Payer: BLUE CROSS/BLUE SHIELD | Admitting: Internal Medicine

## 2017-04-05 ENCOUNTER — Encounter: Payer: Self-pay | Admitting: Internal Medicine

## 2017-04-05 ENCOUNTER — Other Ambulatory Visit: Payer: Self-pay | Admitting: Internal Medicine

## 2017-04-05 DIAGNOSIS — I10 Essential (primary) hypertension: Secondary | ICD-10-CM

## 2017-04-05 MED ORDER — AMLODIPINE BESYLATE 5 MG PO TABS: 5.0000 mg | ORAL_TABLET | Freq: Every day | ORAL | 1 refills | Status: DC

## 2017-04-07 ENCOUNTER — Other Ambulatory Visit: Payer: Self-pay | Admitting: Internal Medicine

## 2017-04-07 DIAGNOSIS — I10 Essential (primary) hypertension: Secondary | ICD-10-CM

## 2017-04-07 DIAGNOSIS — E119 Type 2 diabetes mellitus without complications: Secondary | ICD-10-CM

## 2017-04-07 DIAGNOSIS — E785 Hyperlipidemia, unspecified: Secondary | ICD-10-CM

## 2017-04-12 ENCOUNTER — Encounter: Payer: Self-pay | Admitting: Internal Medicine

## 2017-04-12 ENCOUNTER — Other Ambulatory Visit: Payer: Self-pay | Admitting: Internal Medicine

## 2017-04-12 DIAGNOSIS — Z0000 Encounter for general adult medical examination without abnormal findings: Secondary | ICD-10-CM

## 2017-04-22 ENCOUNTER — Encounter: Payer: Medicare HMO | Admitting: Internal Medicine

## 2017-05-04 ENCOUNTER — Encounter: Payer: Self-pay | Admitting: Internal Medicine

## 2017-05-05 ENCOUNTER — Other Ambulatory Visit: Payer: Self-pay | Admitting: Internal Medicine

## 2017-05-05 DIAGNOSIS — E119 Type 2 diabetes mellitus without complications: Secondary | ICD-10-CM

## 2017-05-05 DIAGNOSIS — E785 Hyperlipidemia, unspecified: Secondary | ICD-10-CM

## 2017-05-05 MED ORDER — ATORVASTATIN CALCIUM 20 MG PO TABS: 20.0000 mg | ORAL_TABLET | Freq: Every day | ORAL | 0 refills | Status: DC

## 2017-05-20 ENCOUNTER — Encounter: Payer: Medicare HMO | Admitting: Internal Medicine

## 2017-05-26 DIAGNOSIS — H52223 Regular astigmatism, bilateral: Secondary | ICD-10-CM | POA: Diagnosis not present

## 2017-05-26 DIAGNOSIS — E119 Type 2 diabetes mellitus without complications: Secondary | ICD-10-CM | POA: Diagnosis not present

## 2017-05-26 DIAGNOSIS — H524 Presbyopia: Secondary | ICD-10-CM | POA: Diagnosis not present

## 2017-05-26 DIAGNOSIS — H5213 Myopia, bilateral: Secondary | ICD-10-CM | POA: Diagnosis not present

## 2017-05-26 LAB — HM DIABETES EYE EXAM

## 2017-05-27 ENCOUNTER — Other Ambulatory Visit (INDEPENDENT_AMBULATORY_CARE_PROVIDER_SITE_OTHER): Payer: Medicare HMO

## 2017-05-27 ENCOUNTER — Encounter: Payer: Self-pay | Admitting: Internal Medicine

## 2017-05-27 ENCOUNTER — Other Ambulatory Visit: Payer: Self-pay

## 2017-05-27 ENCOUNTER — Ambulatory Visit (HOSPITAL_COMMUNITY): Payer: Medicare HMO | Attending: Cardiology

## 2017-05-27 ENCOUNTER — Ambulatory Visit (INDEPENDENT_AMBULATORY_CARE_PROVIDER_SITE_OTHER): Payer: Medicare HMO | Admitting: Internal Medicine

## 2017-05-27 VITALS — BP 140/84 | HR 89 | Temp 97.9°F | Resp 16 | Ht 70.0 in | Wt 257.0 lb

## 2017-05-27 DIAGNOSIS — I10 Essential (primary) hypertension: Secondary | ICD-10-CM | POA: Diagnosis not present

## 2017-05-27 DIAGNOSIS — E781 Pure hyperglyceridemia: Secondary | ICD-10-CM

## 2017-05-27 DIAGNOSIS — I340 Nonrheumatic mitral (valve) insufficiency: Secondary | ICD-10-CM | POA: Insufficient documentation

## 2017-05-27 DIAGNOSIS — I421 Obstructive hypertrophic cardiomyopathy: Secondary | ICD-10-CM | POA: Insufficient documentation

## 2017-05-27 DIAGNOSIS — I77810 Thoracic aortic ectasia: Secondary | ICD-10-CM | POA: Insufficient documentation

## 2017-05-27 DIAGNOSIS — E119 Type 2 diabetes mellitus without complications: Secondary | ICD-10-CM

## 2017-05-27 DIAGNOSIS — R011 Cardiac murmur, unspecified: Secondary | ICD-10-CM

## 2017-05-27 DIAGNOSIS — K5904 Chronic idiopathic constipation: Secondary | ICD-10-CM

## 2017-05-27 DIAGNOSIS — R9431 Abnormal electrocardiogram [ECG] [EKG]: Secondary | ICD-10-CM | POA: Diagnosis not present

## 2017-05-27 DIAGNOSIS — Z0000 Encounter for general adult medical examination without abnormal findings: Secondary | ICD-10-CM

## 2017-05-27 DIAGNOSIS — N400 Enlarged prostate without lower urinary tract symptoms: Secondary | ICD-10-CM

## 2017-05-27 DIAGNOSIS — I119 Hypertensive heart disease without heart failure: Secondary | ICD-10-CM | POA: Diagnosis not present

## 2017-05-27 DIAGNOSIS — R9341 Abnormal radiologic findings on diagnostic imaging of renal pelvis, ureter, or bladder: Secondary | ICD-10-CM | POA: Insufficient documentation

## 2017-05-27 DIAGNOSIS — I272 Pulmonary hypertension, unspecified: Secondary | ICD-10-CM | POA: Insufficient documentation

## 2017-05-27 DIAGNOSIS — E785 Hyperlipidemia, unspecified: Secondary | ICD-10-CM

## 2017-05-27 DIAGNOSIS — E559 Vitamin D deficiency, unspecified: Secondary | ICD-10-CM | POA: Insufficient documentation

## 2017-05-27 LAB — CBC WITH DIFFERENTIAL/PLATELET
BASOS PCT: 0.9 % (ref 0.0–3.0)
Basophils Absolute: 0.1 10*3/uL (ref 0.0–0.1)
EOS PCT: 4.9 % (ref 0.0–5.0)
Eosinophils Absolute: 0.4 10*3/uL (ref 0.0–0.7)
HEMATOCRIT: 41.4 % (ref 39.0–52.0)
HEMOGLOBIN: 14.8 g/dL (ref 13.0–17.0)
LYMPHS PCT: 23 % (ref 12.0–46.0)
Lymphs Abs: 1.7 10*3/uL (ref 0.7–4.0)
MCHC: 35.7 g/dL (ref 30.0–36.0)
MCV: 90.8 fl (ref 78.0–100.0)
MONO ABS: 0.6 10*3/uL (ref 0.1–1.0)
Monocytes Relative: 7.5 % (ref 3.0–12.0)
Neutro Abs: 4.8 10*3/uL (ref 1.4–7.7)
Neutrophils Relative %: 63.7 % (ref 43.0–77.0)
Platelets: 175 10*3/uL (ref 150.0–400.0)
RBC: 4.56 Mil/uL (ref 4.22–5.81)
RDW: 12.9 % (ref 11.5–15.5)
WBC: 7.5 10*3/uL (ref 4.0–10.5)

## 2017-05-27 LAB — LIPID PANEL
CHOLESTEROL: 102 mg/dL (ref 0–200)
HDL: 31.3 mg/dL — AB (ref >39.00)
NonHDL: 70.24
TRIGLYCERIDES: 257 mg/dL — AB (ref 0.0–149.0)
Total CHOL/HDL Ratio: 3
VLDL: 51.4 mg/dL — ABNORMAL HIGH (ref 0.0–40.0)

## 2017-05-27 LAB — TSH: TSH: 0.85 u[IU]/mL (ref 0.35–4.50)

## 2017-05-27 LAB — ECHOCARDIOGRAM COMPLETE
Height: 70 in
WEIGHTICAEL: 4112 [oz_av]

## 2017-05-27 LAB — URINALYSIS, ROUTINE W REFLEX MICROSCOPIC
Bilirubin Urine: NEGATIVE
HGB URINE DIPSTICK: NEGATIVE
Ketones, ur: NEGATIVE
Leukocytes, UA: NEGATIVE
NITRITE: NEGATIVE
SPECIFIC GRAVITY, URINE: 1.025 (ref 1.000–1.030)
Total Protein, Urine: NEGATIVE
Urine Glucose: >=1000 — AB
Urobilinogen, UA: 0.2 (ref 0.0–1.0)
pH: 5.5 (ref 5.0–8.0)

## 2017-05-27 LAB — PSA: PSA: 2.66 ng/mL (ref 0.10–4.00)

## 2017-05-27 LAB — COMPREHENSIVE METABOLIC PANEL
ALT: 32 U/L (ref 0–53)
AST: 27 U/L (ref 0–37)
Albumin: 4.4 g/dL (ref 3.5–5.2)
Alkaline Phosphatase: 40 U/L (ref 39–117)
BILIRUBIN TOTAL: 0.9 mg/dL (ref 0.2–1.2)
BUN: 20 mg/dL (ref 6–23)
CALCIUM: 9.2 mg/dL (ref 8.4–10.5)
CO2: 22 meq/L (ref 19–32)
Chloride: 107 mEq/L (ref 96–112)
Creatinine, Ser: 1.23 mg/dL (ref 0.40–1.50)
GFR: 61.54 mL/min (ref >60.00)
Glucose, Bld: 179 mg/dL — ABNORMAL HIGH (ref 70–99)
Potassium: 4.1 mEq/L (ref 3.5–5.1)
SODIUM: 138 meq/L (ref 135–145)
Total Protein: 7 g/dL (ref 6.0–8.3)

## 2017-05-27 LAB — VITAMIN D 25 HYDROXY (VIT D DEFICIENCY, FRACTURES): VITD: 26.99 ng/mL — ABNORMAL LOW (ref 30.00–100.00)

## 2017-05-27 LAB — MICROALBUMIN / CREATININE URINE RATIO
Creatinine,U: 141.6 mg/dL
MICROALB/CREAT RATIO: 0.7 mg/g (ref 0.0–30.0)
Microalb, Ur: 1 mg/dL (ref 0.0–1.9)

## 2017-05-27 LAB — HEMOGLOBIN A1C: Hgb A1c MFr Bld: 7.7 % — ABNORMAL HIGH (ref 4.6–6.5)

## 2017-05-27 LAB — LDL CHOLESTEROL, DIRECT: LDL DIRECT: 40 mg/dL

## 2017-05-27 MED ORDER — CHOLECALCIFEROL 50 MCG (2000 UT) PO TABS: 1.0000 | ORAL_TABLET | Freq: Every day | ORAL | 1 refills | Status: DC

## 2017-05-27 MED ORDER — AZILSARTAN MEDOXOMIL 80 MG PO TABS: 1.0000 | ORAL_TABLET | Freq: Every day | ORAL | 1 refills | Status: DC

## 2017-05-27 MED ORDER — PLECANATIDE 3 MG PO TABS
1.0000 | ORAL_TABLET | Freq: Every day | ORAL | 1 refills | Status: DC
Start: 2017-05-27 — End: 2017-05-28

## 2017-05-27 MED ORDER — EMPAGLIFLOZIN-METFORMIN HCL 5-500 MG PO TABS: 1.0000 | ORAL_TABLET | Freq: Every day | ORAL | 1 refills | Status: DC

## 2017-05-27 NOTE — Patient Instructions (Signed)

## 2017-05-27 NOTE — Progress Notes (Signed)
Subjective:  Patient ID: Dakota Abbott, male    DOB: 03-11-1945  Age: 72 y.o. MRN: [id number]  CC: Annual Exam; Hypertension; Hyperlipidemia; and Diabetes   HPI Dakota Abbott presents for a CPX.  He complains that his blood pressure and blood sugars have not been well controlled.  He underwent a Lexiscan earlier this year for evaluation of DOE.  He tells me he has had no more episodes of DOE and he denies chest pain, palpitations, edema, or fatigue.  He said he had a heart murmur all of his life and he does not know what causes it.  Past Medical History:  Diagnosis Date  . Arthritis   . Colon cancer ([place])   . Colon polyps    due for f/u colonoscopy march 2015; Dr Caleb Popp  . Degenerative joint disease 05/05/2010  . Diabetes mellitus   . Erectile dysfunction 05/05/2010  . Heart murmur   . Hypertension   . Type II or unspecified type diabetes mellitus without mention of complication, uncontrolled 05/05/2010   Past Surgical History:  Procedure Laterality Date  . colon surgury  01/19/2006  . COLONOSCOPY    . HERNIA REPAIR     RIGHT    reports that he has quit smoking. He has never used smokeless tobacco. He reports that he does not drink alcohol or use drugs. family history includes Arthritis in his father and mother; Diabetes in his sister; Heart disease in his father and mother; Hypertension in his father and mother. Allergies  Allergen Reactions  . Lisinopril Cough    Outpatient Medications Prior to Visit  Medication Sig Dispense Refill  . amLODipine (NORVASC) 5 MG tablet Take 1 tablet (5 mg total) by mouth daily. 90 tablet 1  . atorvastatin (LIPITOR) 20 MG tablet Take 1 tablet (20 mg total) by mouth daily. 30 tablet 0  . blood glucose meter kit and supplies KIT Use to test blood up to twice daily. DX: E11.8 1 each 0  . cetirizine (ZYRTEC) 10 MG tablet Take 1 tablet (10 mg total) by mouth daily. 30 tablet 11  . fluticasone (FLONASE) 50 MCG/ACT nasal spray Place 2 sprays  into both nostrils daily. 16 g 11  . gabapentin (NEURONTIN) 100 MG capsule Take 2 capsules (200 mg total) by mouth at bedtime. 180 capsule 0  . glucose blood (COOL BLOOD GLUCOSE TEST STRIPS) test strip Use to test blood sugar up to twice daily. DX: E11.8 200 each 3  . polyethylene glycol powder (GLYCOLAX/MIRALAX) powder Take 17 g by mouth 2 (two) times daily as needed. 3350 g 1  . TRUEPLUS LANCETS 30G MISC Use to test blood sugar up to twice daily. DX: E11.8 200 each 3  . aspirin (ASPIRIN LOW DOSE) 81 MG EC tablet Take 1 tablet (81 mg total) daily by mouth. 90 tablet 1  . glimepiride (AMARYL) 4 MG tablet Take 1 tablet (4 mg total) by mouth daily. 90 tablet 0  . losartan (COZAAR) 100 MG tablet Take 1 tablet (100 mg total) by mouth daily. 90 tablet 0  . lubiprostone (AMITIZA) 24 MCG capsule Take 1 capsule (24 mcg total) by mouth 2 (two) times daily with a meal. 180 capsule 1  . Multiple Vitamins-Minerals (MULTIVITAMIN) tablet Take 1 tablet daily by mouth. 90 tablet 1   No facility-administered medications prior to visit.     ROS Review of Systems  Constitutional: Negative for chills, diaphoresis, fatigue and unexpected weight change.  HENT: Negative.   Eyes: Negative.  Respiratory: Negative for apnea, cough, chest tightness, shortness of breath and wheezing.   Cardiovascular: Negative for chest pain, palpitations and leg swelling.  Gastrointestinal: Positive for constipation. Negative for abdominal pain, diarrhea, nausea and vomiting.       He tried Amitiza for constipation but he said it did not help.  Endocrine: Negative for polydipsia, polyphagia and polyuria.  Genitourinary: Negative.  Negative for difficulty urinating, dysuria, penile swelling, scrotal swelling, testicular pain and urgency.  Musculoskeletal: Negative.  Negative for arthralgias, myalgias and neck pain.  Skin: Negative.  Negative for color change, pallor and rash.  Allergic/Immunologic: Negative.   Neurological:  Negative.  Negative for dizziness, weakness, light-headedness and numbness.  Hematological: Negative for adenopathy. Does not bruise/bleed easily.  Psychiatric/Behavioral: Negative.     Objective:  BP 140/84 (BP Location: Left Arm, Patient Position: Sitting, Cuff Size: Normal)   Pulse 89   Temp 97.9 F (36.6 C) (Oral)   Resp 16   Ht '5\' 10"'$  (1.778 m)   Wt 257 lb (116.6 kg)   SpO2 98%   BMI 36.88 kg/m   BP Readings from Last 3 Encounters:  05/27/17 140/84  08/03/16 140/80  02/24/16 128/70    Wt Readings from Last 3 Encounters:  05/27/17 257 lb (116.6 kg)  03/01/17 256 lb (116.1 kg)  08/03/16 256 lb (116.1 kg)    Physical Exam  Constitutional: He is oriented to person, place, and time. No distress.  HENT:  Mouth/Throat: Oropharynx is clear and moist. No oropharyngeal exudate.  Eyes: Conjunctivae are normal. No scleral icterus.  Neck: Normal range of motion. Neck supple. No JVD present. No thyromegaly present.  Cardiovascular: Normal rate, regular rhythm, S1 normal and S2 normal. Exam reveals no gallop.  Murmur heard.  Systolic murmur is present with a grade of 1/6.  No diastolic murmur is present. EKG ---  Sinus  Rhythm  -Left axis -anterior fascicular block.   -Poor R-wave progression -nonspecific -consider old anterior infarct.   -  Nonspecific T-abnormality.   ABNORMAL    Pulmonary/Chest: Effort normal and breath sounds normal. No respiratory distress. He has no wheezes. He has no rales.  Abdominal: Soft. Bowel sounds are normal. He exhibits no mass. There is no hepatosplenomegaly. There is no tenderness. Hernia confirmed negative in the right inguinal area and confirmed negative in the left inguinal area.  Genitourinary: Rectum normal, testes normal and penis normal. Rectal exam shows no external hemorrhoid, no internal hemorrhoid, no fissure, no mass, no tenderness, anal tone normal and guaiac negative stool. Prostate is enlarged (1+ smooth symm BPH). Prostate is  not tender. Cremasteric reflex is present. Right testis shows no mass, no swelling and no tenderness. Left testis shows no mass, no swelling and no tenderness. Uncircumcised. No phimosis, paraphimosis, hypospadias, penile erythema or penile tenderness. No discharge found.  Musculoskeletal: Normal range of motion. He exhibits no edema, tenderness or deformity.  Lymphadenopathy:    He has no cervical adenopathy. No inguinal adenopathy noted on the right or left side.  Neurological: He is alert and oriented to person, place, and time.  Skin: Skin is warm and dry. He is not diaphoretic.  Psychiatric: He has a normal mood and affect. His behavior is normal. Judgment and thought content normal.  Vitals reviewed.   Lab Results  Component Value Date   WBC 7.5 05/27/2017   HGB 14.8 05/27/2017   HCT 41.4 05/27/2017   PLT 175.0 05/27/2017   GLUCOSE 179 (H) 05/27/2017   CHOL 102 05/27/2017  TRIG 257.0 (H) 05/27/2017   HDL 31.30 (L) 05/27/2017   LDLDIRECT 40.0 05/27/2017   LDLCALC 67 01/30/2013   ALT 32 05/27/2017   AST 27 05/27/2017   NA 138 05/27/2017   K 4.1 05/27/2017   CL 107 05/27/2017   CREATININE 1.23 05/27/2017   BUN 20 05/27/2017   CO2 22 05/27/2017   TSH 0.85 05/27/2017   PSA 2.66 05/27/2017   HGBA1C 7.7 (H) 05/27/2017   MICROALBUR 1.0 05/27/2017    Dg Lumbar Spine Complete  Result Date: 02/18/2016 CLINICAL DATA:  Low back pain, right leg pain for 2 weeks EXAM: LUMBAR SPINE - COMPLETE 4+ VIEW COMPARISON:  02/11/2016 FINDINGS: Five views of the lumbar spine submitted. No acute fracture or subluxation. There is disc space flattening with vacuum disc phenomenon at T12-L1 and L1-L2 level. Mild anterior spurring upper and lower endplate of L1 and upper endplate of L2 vertebral body. Mild disc space flattening at L5-S1 level. Mild facet degenerative changes L5 level. IMPRESSION: No acute fracture or subluxation. Degenerative changes as described above. Electronically Signed   By: Lahoma Crocker M.D.   On: 02/18/2016 15:27    Assessment & Plan:   [place]ali was seen today for annual exam, hypertension, hyperlipidemia and diabetes.  Diagnoses and all orders for this visit:  Controlled type 2 diabetes mellitus without complication, without long-term current use of insulin ([place])- His A1c is up to 7.7% and he has gained weight on the sulfonylurea.  I have asked him to upgrade to the combination of an SGLT2 inhibitor and metformin. -     Microalbumin / creatinine urine ratio; Future -     Hemoglobin A1c; Future -     Comprehensive metabolic panel; Future -     Discontinue: Azilsartan Medoxomil 80 MG TABS; Take 1 tablet (80 mg total) by mouth daily. -     Discontinue: Empagliflozin-metFORMIN HCl (SYNJARDY) 5-500 MG TABS; Take 1 tablet by mouth daily.  Essential hypertension- His blood pressure is not adequately well controlled.  I will treat the vitamin D deficiency.  I will also upgrade him to a more potent ARB.  His labs are negative for secondary causes or endorgan damage. -     CBC with Differential/Platelet; Future -     TSH; Future -     Urinalysis, Routine w reflex microscopic; Future -     Comprehensive metabolic panel; Future -     Discontinue: Azilsartan Medoxomil 80 MG TABS; Take 1 tablet (80 mg total) by mouth daily. -     VITAMIN D 25 Hydroxy (Vit-D Deficiency, Fractures); Future  Hyperlipidemia LDL goal <100- He has achieved his LDL goal and is doing well on the statin. -     Lipid panel; Future -     TSH; Future  Pure hyperglyceridemia- Improvement noted -     Lipid panel; Future  Benign prostatic hyperplasia without lower urinary tract symptoms- His PSA is not rising which is reassuring that he does not have prostate cancer. -     Urinalysis, Routine w reflex microscopic; Future -     PSA; Future  Chronic idiopathic constipation -     Discontinue: Plecanatide (TRULANCE) 3 MG TABS; Take 1 tablet by mouth daily.  Murmur -     ECHOCARDIOGRAM COMPLETE; Future -      Ambulatory referral to Cardiology  Abnormal electrocardiogram (ECG) (EKG) -     ECHOCARDIOGRAM COMPLETE; Future -     EKG 12-Lead -     Ambulatory referral  to Cardiology  Mild HOCM (hypertrophic obstructive cardiomyopathy) ([place])- This was revealed on the echocardiogram performed today.  I have asked him to see a cardiologist about this. -     Ambulatory referral to Cardiology  Vitamin D deficiency -     Cholecalciferol 2000 units TABS; Take 1 tablet (2,000 Units total) by mouth daily.   I have discontinued Jeneen Rinks A. Usman's lubiprostone, multivitamin, losartan, and glimepiride. I am also having him start on Cholecalciferol. Additionally, I am having him maintain his cetirizine, fluticasone, polyethylene glycol powder, gabapentin, blood glucose meter kit and supplies, TRUEPLUS LANCETS 30G, glucose blood, amLODipine, and atorvastatin.  Meds ordered this encounter  Medications  . DISCONTD: Azilsartan Medoxomil 80 MG TABS    Sig: Take 1 tablet (80 mg total) by mouth daily.    Dispense:  90 tablet    Refill:  1  . DISCONTD: Plecanatide (TRULANCE) 3 MG TABS    Sig: Take 1 tablet by mouth daily.    Dispense:  90 tablet    Refill:  1  . DISCONTD: Empagliflozin-metFORMIN HCl (SYNJARDY) 5-500 MG TABS    Sig: Take 1 tablet by mouth daily.    Dispense:  180 tablet    Refill:  1  . Cholecalciferol 2000 units TABS    Sig: Take 1 tablet (2,000 Units total) by mouth daily.    Dispense:  90 tablet    Refill:  1   See AVS for instructions about healthy living and anticipatory guidance.  Follow-up: Return in about 3 months (around 08/27/2017).  Scarlette Calico, MD

## 2017-05-28 ENCOUNTER — Encounter: Payer: Self-pay | Admitting: Internal Medicine

## 2017-05-28 ENCOUNTER — Telehealth: Payer: Self-pay | Admitting: Internal Medicine

## 2017-05-28 DIAGNOSIS — I10 Essential (primary) hypertension: Secondary | ICD-10-CM

## 2017-05-28 DIAGNOSIS — Z0000 Encounter for general adult medical examination without abnormal findings: Secondary | ICD-10-CM

## 2017-05-28 DIAGNOSIS — K5904 Chronic idiopathic constipation: Secondary | ICD-10-CM

## 2017-05-28 DIAGNOSIS — E119 Type 2 diabetes mellitus without complications: Secondary | ICD-10-CM

## 2017-05-28 MED ORDER — PLECANATIDE 3 MG PO TABS: 1.0000 | ORAL_TABLET | Freq: Every day | ORAL | 0 refills | Status: DC

## 2017-05-28 MED ORDER — AZILSARTAN MEDOXOMIL 80 MG PO TABS: 1.0000 | ORAL_TABLET | Freq: Every day | ORAL | 0 refills | Status: DC

## 2017-05-28 MED ORDER — THERA VITAL M PO TABS: 1.0000 | ORAL_TABLET | Freq: Every day | ORAL | 3 refills | Status: DC

## 2017-05-28 MED ORDER — EMPAGLIFLOZIN-METFORMIN HCL 5-500 MG PO TABS: 1.0000 | ORAL_TABLET | Freq: Every day | ORAL | 0 refills | Status: DC

## 2017-05-28 MED ORDER — ASPIRIN 81 MG PO TBEC: 81.0000 mg | DELAYED_RELEASE_TABLET | Freq: Every day | ORAL | 1 refills | Status: DC

## 2017-05-28 NOTE — Telephone Encounter (Signed)
Spoke to spouse and they need an alternative to the [place] and  azilsartan due to the cost. Please advise.

## 2017-05-28 NOTE — Telephone Encounter (Signed)
Spouse spoke with team health stating that patients had a medication change but did not note any medication names.  States script was sent to [facility].  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  Spouse is requesting call back in regard to give the remaining of the information.

## 2017-05-31 NOTE — Assessment & Plan Note (Signed)

## 2017-06-01 ENCOUNTER — Other Ambulatory Visit: Payer: Self-pay | Admitting: Internal Medicine

## 2017-06-01 ENCOUNTER — Encounter: Payer: Self-pay | Admitting: Internal Medicine

## 2017-06-01 DIAGNOSIS — I10 Essential (primary) hypertension: Secondary | ICD-10-CM

## 2017-06-01 DIAGNOSIS — E785 Hyperlipidemia, unspecified: Secondary | ICD-10-CM

## 2017-06-01 DIAGNOSIS — E119 Type 2 diabetes mellitus without complications: Secondary | ICD-10-CM

## 2017-06-01 MED ORDER — ATORVASTATIN CALCIUM 20 MG PO TABS: 20.0000 mg | ORAL_TABLET | Freq: Every day | ORAL | 1 refills | Status: DC

## 2017-06-01 MED ORDER — OLMESARTAN MEDOXOMIL 40 MG PO TABS: 40.0000 mg | ORAL_TABLET | Freq: Every day | ORAL | 2 refills | Status: DC

## 2017-06-01 MED ORDER — METFORMIN HCL ER 750 MG PO TB24: 1500.0000 mg | ORAL_TABLET | Freq: Every day | ORAL | 2 refills | Status: DC

## 2017-06-01 MED ORDER — METFORMIN HCL ER 750 MG PO TB24: 1500.0000 mg | ORAL_TABLET | Freq: Every day | ORAL | 1 refills | Status: DC

## 2017-06-01 MED ORDER — GABAPENTIN 100 MG PO CAPS: 200.0000 mg | ORAL_CAPSULE | Freq: Every day | ORAL | 1 refills | Status: DC

## 2017-06-01 MED ORDER — OLMESARTAN MEDOXOMIL 40 MG PO TABS: 40.0000 mg | ORAL_TABLET | Freq: Every day | ORAL | 1 refills | Status: DC

## 2017-06-01 MED ORDER — AMLODIPINE BESYLATE 5 MG PO TABS
5.0000 mg | ORAL_TABLET | Freq: Every day | ORAL | 1 refills | Status: DC
Start: 2017-06-01 — End: 2017-08-23

## 2017-06-01 NOTE — Telephone Encounter (Signed)
Changed RX's sent

## 2017-06-01 NOTE — Telephone Encounter (Signed)
Set a mychart message to pt informing of rx change.

## 2017-06-01 NOTE — Addendum Note (Signed)
Addended by: Karle Barr on: 06/01/2017 04:46 PM   Modules accepted: Orders

## 2017-06-04 ENCOUNTER — Encounter: Payer: Self-pay | Admitting: Internal Medicine

## 2017-06-07 ENCOUNTER — Other Ambulatory Visit: Payer: Self-pay | Admitting: Internal Medicine

## 2017-06-07 ENCOUNTER — Encounter: Payer: Self-pay | Admitting: Internal Medicine

## 2017-06-07 DIAGNOSIS — E119 Type 2 diabetes mellitus without complications: Secondary | ICD-10-CM

## 2017-06-07 MED ORDER — EMPAGLIFLOZIN 10 MG PO TABS: 10.0000 mg | ORAL_TABLET | Freq: Every day | ORAL | 1 refills | Status: DC

## 2017-06-17 ENCOUNTER — Encounter: Payer: Self-pay | Admitting: Internal Medicine

## 2017-06-18 ENCOUNTER — Encounter: Payer: Self-pay | Admitting: Internal Medicine

## 2017-06-21 ENCOUNTER — Other Ambulatory Visit: Payer: Self-pay | Admitting: Internal Medicine

## 2017-06-23 ENCOUNTER — Encounter: Payer: Self-pay | Admitting: Internal Medicine

## 2017-06-23 ENCOUNTER — Telehealth: Payer: Self-pay | Admitting: *Deleted

## 2017-06-23 NOTE — Telephone Encounter (Signed)
Trulance 3 mg PA initiated via CoverMyMeds Key: [id number]

## 2017-06-30 NOTE — Telephone Encounter (Signed)
Patient's wife said that she needs him to be changed to a generic diabetes medication to go along with the metformin. Please advise. He is not eligible for patient assistance.

## 2017-07-01 ENCOUNTER — Encounter: Payer: Medicare HMO | Admitting: Internal Medicine

## 2017-07-06 ENCOUNTER — Ambulatory Visit: Payer: Medicare HMO | Admitting: Cardiovascular Disease

## 2017-07-10 ENCOUNTER — Encounter: Payer: Self-pay | Admitting: Internal Medicine

## 2017-07-12 ENCOUNTER — Encounter: Payer: Self-pay | Admitting: Internal Medicine

## 2017-07-13 ENCOUNTER — Other Ambulatory Visit: Payer: Self-pay | Admitting: Internal Medicine

## 2017-07-13 DIAGNOSIS — Z794 Long term (current) use of insulin: Principal | ICD-10-CM

## 2017-07-13 DIAGNOSIS — E119 Type 2 diabetes mellitus without complications: Secondary | ICD-10-CM

## 2017-07-13 MED ORDER — DAPAGLIFLOZIN PROPANEDIOL 5 MG PO TABS: 5.0000 mg | ORAL_TABLET | Freq: Every day | ORAL | 1 refills | Status: DC

## 2017-07-13 NOTE — Telephone Encounter (Signed)
Bag of samples is on my desk.

## 2017-07-20 ENCOUNTER — Ambulatory Visit (INDEPENDENT_AMBULATORY_CARE_PROVIDER_SITE_OTHER): Payer: Medicare HMO | Admitting: Cardiovascular Disease

## 2017-07-20 ENCOUNTER — Encounter: Payer: Self-pay | Admitting: Cardiovascular Disease

## 2017-07-20 DIAGNOSIS — I10 Essential (primary) hypertension: Secondary | ICD-10-CM | POA: Diagnosis not present

## 2017-07-20 DIAGNOSIS — E785 Hyperlipidemia, unspecified: Secondary | ICD-10-CM

## 2017-07-20 DIAGNOSIS — I421 Obstructive hypertrophic cardiomyopathy: Secondary | ICD-10-CM | POA: Diagnosis not present

## 2017-07-20 NOTE — Assessment & Plan Note (Signed)
History of essential hypertension her blood pressure measured today 114/68.  He is on amlodipine and Benicar.  Continue current meds at current dosing.

## 2017-07-20 NOTE — Progress Notes (Signed)
07/20/2017 Dakota Abbott   16-Mar-1945  [id number]  Primary Physician Dakota Lima, MD Primary Cardiologist: Dakota Harp MD Dakota Abbott, [place]  HPI:  Dakota Abbott is a 72 y.o. mild to moderately overweight married Caucasian male father of 37, grandfather 5 grandchildren great-grandfather to 66 great grandchildren who works as a Programmer, systems and was referred by Dr. Scarlette Abbott, his PCP, for evaluation of an auscultated cardiac murmur.  Cardiac risk factors include 30 pack years of tobacco abuse having quit back in 2005.  History of hypertension, diabetes and hyperlipidemia.  Both his father and brother had coronary artery bypass grafting.  He has never had a heart attack or stroke and denies chest pain or shortness of breath.  He did have a negative Myoview 03/01/2017.  Because of an auscultated murmur he had a 2D echo performed 05/27/2017 that showed hypertrophic obstructive cardia myopathy with severe chordal SAM and a peak outflow tract gradient of 6.1 m/s though he is completely asymptomatic.   Current Meds  Medication Sig  . amLODipine (NORVASC) 5 MG tablet Take 1 tablet (5 mg total) by mouth daily.  Marland Kitchen aspirin (ASPIRIN LOW DOSE) 81 MG EC tablet Take 1 tablet (81 mg total) by mouth daily.  Marland Kitchen atorvastatin (LIPITOR) 20 MG tablet Take 1 tablet (20 mg total) by mouth daily.  . blood glucose meter kit and supplies KIT Use to test blood up to twice daily. DX: E11.8  . cetirizine (ZYRTEC) 10 MG tablet Take 1 tablet (10 mg total) by mouth daily.  . Cholecalciferol 2000 units TABS Take 1 tablet (2,000 Units total) by mouth daily.  . dapagliflozin propanediol (FARXIGA) 5 MG TABS tablet Take 5 mg by mouth daily.  . fluticasone (FLONASE) 50 MCG/ACT nasal spray Place 2 sprays into both nostrils daily.  Marland Kitchen gabapentin (NEURONTIN) 100 MG capsule Take 2 capsules (200 mg total) by mouth at bedtime.  Marland Kitchen glucose blood (COOL BLOOD GLUCOSE TEST STRIPS) test strip Use to test blood  sugar up to twice daily. DX: E11.8  . metFORMIN (GLUCOPHAGE-XR) 750 MG 24 hr tablet Take 2 tablets (1,500 mg total) by mouth daily with breakfast.  . Multiple Vitamins-Minerals (MULTIVITAMIN) tablet Take 1 tablet by mouth daily.  Marland Kitchen olmesartan (BENICAR) 40 MG tablet Take 1 tablet (40 mg total) by mouth daily.  Marland Kitchen Plecanatide (TRULANCE) 3 MG TABS Take 1 tablet by mouth daily.  . polyethylene glycol powder (GLYCOLAX/MIRALAX) powder Take 17 g by mouth 2 (two) times daily as needed.  . TRUEPLUS LANCETS 30G MISC Use to test blood sugar up to twice daily. DX: E11.8     Allergies  Allergen Reactions  . Lisinopril Cough    Social History   Socioeconomic History  . Marital status: Married    Spouse name: Not on file  . Number of children: Not on file  . Years of education: 92  . Highest education level: Not on file  Occupational History  . 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 Financial resource strain: Not on file  . Food insecurity:    Worry: Not on file    Inability: Not on file  . Transportation needs:    Medical: Not on file    Non-medical: Not on file  Tobacco Use  . Smoking status: Former Research scientist (life sciences)  . Smokeless tobacco: Never Used  . Tobacco comment: quit 5 yrs. ago.  Substance and Sexual  Activity  . Alcohol use: No  . Drug use: No  . Sexual activity: Yes  Lifestyle  . Physical activity:    Days per week: Not on file    Minutes per session: Not on file  . Stress: Not on file  Relationships  . Social connections:    Talks on phone: Not on file    Gets together: Not on file    Attends religious service: Not on file    Active member of club or organization: Not on file    Attends meetings of clubs or organizations: Not on file    Relationship status: Not on file  . Intimate partner violence:    Fear of current or ex partner: Not on file    Emotionally abused: Not on file    Physically abused: Not on file    Forced  sexual activity: Not on file  Other Topics Concern  . 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  Cardiovascular: negative for chest pain, dyspnea on exertion, edema, orthopnea, palpitations, paroxysmal nocturnal dyspnea or shortness of breath Dermatological: negative for rash Respiratory: negative for cough or wheezing Urologic: negative for hematuria Abdominal: negative for nausea, vomiting, diarrhea, bright red blood per rectum, melena, or hematemesis Neurologic: negative for visual changes, syncope, or dizziness All other systems reviewed and are otherwise negative except as noted above.    Blood pressure 114/68, pulse 77, height '5\' 10"'$  (1.778 m), weight 256 lb (116.1 kg).  General appearance: alert and no distress Neck: no adenopathy, no carotid bruit, no JVD, supple, symmetrical, trachea midline and thyroid not enlarged, symmetric, no tenderness/mass/nodules Lungs: clear to auscultation bilaterally Heart: 2/6 outflow tract murmur at the base which increased with Valsalva maneuver. Extremities: extremities normal, atraumatic, no cyanosis or edema Pulses: 2+ and symmetric Skin: Skin color, texture, turgor normal. No rashes or lesions Neurologic: Alert and oriented X 3, normal strength and tone. Normal symmetric reflexes. Normal coordination and gait  EKG sinus rhythm at 77 with left axis deviation, lateral Q waves and septal Q waves.  I personally reviewed this EKG.  ASSESSMENT AND PLAN:   Hypertension History of essential hypertension her blood pressure measured today 114/68.  He is on amlodipine and Benicar.  Continue current meds at current dosing.  Hyperlipidemia LDL goal <100 History of hyperlipidemia on statin therapy with lipid profile performed 05/27/2017 revealing total cholesterol 102 , and a triglyceride level of  257.  Mild HOCM (hypertrophic obstructive cardiomyopathy) ([place]) Long history of cardiac murmur childhood 2D echo performed 05/27/2017 revealing hypertrophic obstructive cardiomyopathy.  He did have a mildly dilated aortic root, normal LV systolic function with severe chordal S.A.M. and a peak outflow tract gradient of 6.1 m/s.  He is completely asymptomatic.      Dakota Harp MD FACP,FACC,FAHA, [facility] 07/20/2017 8:39 AM

## 2017-07-20 NOTE — Addendum Note (Signed)
Addended by: Venetia Maxon on: 07/20/2017 08:45 AM   Modules accepted: Orders

## 2017-07-20 NOTE — Assessment & Plan Note (Signed)
History of hyperlipidemia on statin therapy with lipid profile performed 05/27/2017 revealing total cholesterol 102 , and a triglyceride level of 257.

## 2017-07-20 NOTE — Assessment & Plan Note (Signed)
Long history of cardiac murmur childhood 2D echo performed 05/27/2017 revealing hypertrophic obstructive cardiomyopathy.  He did have a mildly dilated aortic root, normal LV systolic function with severe chordal S.A.M. and a peak outflow tract gradient of 6.1 m/s.  He is completely asymptomatic.

## 2017-07-20 NOTE — Patient Instructions (Signed)
Medication Instructions:  Your physician recommends that you continue on your current medications as directed. Please refer to the Current Medication list given to you today.   Labwork: none  Testing/Procedures: none  Follow-Up: Follow up with Dr. Berry as needed.   Any Other Special Instructions Will Be Listed Below (If Applicable).     If you need a refill on your cardiac medications before your next appointment, please call your pharmacy.   

## 2017-07-21 NOTE — Addendum Note (Signed)
Addended by: Venetia Maxon on: 07/21/2017 04:47 PM   Modules accepted: Orders

## 2017-08-02 ENCOUNTER — Encounter: Payer: Self-pay | Admitting: Internal Medicine

## 2017-08-02 DIAGNOSIS — E559 Vitamin D deficiency, unspecified: Secondary | ICD-10-CM

## 2017-08-02 DIAGNOSIS — Z0000 Encounter for general adult medical examination without abnormal findings: Secondary | ICD-10-CM

## 2017-08-02 MED ORDER — ASPIRIN 81 MG PO TBEC: 81.0000 mg | DELAYED_RELEASE_TABLET | Freq: Every day | ORAL | 1 refills | Status: DC

## 2017-08-02 MED ORDER — THERA VITAL M PO TABS: 1.0000 | ORAL_TABLET | Freq: Every day | ORAL | 3 refills | Status: AC

## 2017-08-02 MED ORDER — CHOLECALCIFEROL 50 MCG (2000 UT) PO TABS: 1.0000 | ORAL_TABLET | Freq: Every day | ORAL | 1 refills | Status: AC

## 2017-08-06 ENCOUNTER — Encounter: Payer: Self-pay | Admitting: Internal Medicine

## 2017-08-10 ENCOUNTER — Encounter: Payer: Self-pay | Admitting: Internal Medicine

## 2017-08-16 ENCOUNTER — Other Ambulatory Visit: Payer: Self-pay | Admitting: Internal Medicine

## 2017-08-16 DIAGNOSIS — E119 Type 2 diabetes mellitus without complications: Secondary | ICD-10-CM

## 2017-08-16 DIAGNOSIS — Z794 Long term (current) use of insulin: Principal | ICD-10-CM

## 2017-08-16 MED ORDER — DAPAGLIFLOZIN PROPANEDIOL 10 MG PO TABS: 10.0000 mg | ORAL_TABLET | Freq: Every day | ORAL | 1 refills | Status: AC

## 2017-08-23 ENCOUNTER — Other Ambulatory Visit: Payer: Self-pay | Admitting: Internal Medicine

## 2017-08-23 DIAGNOSIS — E785 Hyperlipidemia, unspecified: Secondary | ICD-10-CM

## 2017-08-23 DIAGNOSIS — I10 Essential (primary) hypertension: Secondary | ICD-10-CM

## 2017-08-23 DIAGNOSIS — E119 Type 2 diabetes mellitus without complications: Secondary | ICD-10-CM

## 2017-08-30 ENCOUNTER — Other Ambulatory Visit: Payer: Self-pay | Admitting: Internal Medicine

## 2017-08-30 ENCOUNTER — Encounter: Payer: Self-pay | Admitting: Internal Medicine

## 2017-09-07 ENCOUNTER — Encounter: Payer: Self-pay | Admitting: Internal Medicine

## 2017-10-16 ENCOUNTER — Other Ambulatory Visit: Payer: Self-pay | Admitting: Internal Medicine

## 2017-10-18 ENCOUNTER — Encounter: Payer: Self-pay | Admitting: Internal Medicine

## 2017-10-25 ENCOUNTER — Encounter: Payer: Self-pay | Admitting: Gastroenterology

## 2017-10-25 ENCOUNTER — Other Ambulatory Visit: Payer: Self-pay | Admitting: Internal Medicine

## 2017-10-25 DIAGNOSIS — E119 Type 2 diabetes mellitus without complications: Secondary | ICD-10-CM

## 2017-11-08 ENCOUNTER — Encounter: Payer: Self-pay | Admitting: Internal Medicine

## 2017-11-11 ENCOUNTER — Ambulatory Visit (INDEPENDENT_AMBULATORY_CARE_PROVIDER_SITE_OTHER): Payer: Medicare HMO | Admitting: Internal Medicine

## 2017-11-11 ENCOUNTER — Encounter: Payer: Self-pay | Admitting: Internal Medicine

## 2017-11-11 ENCOUNTER — Other Ambulatory Visit (INDEPENDENT_AMBULATORY_CARE_PROVIDER_SITE_OTHER): Payer: Medicare HMO

## 2017-11-11 VITALS — BP 122/70 | HR 72 | Temp 98.7°F | Resp 16 | Ht 70.0 in | Wt 240.0 lb

## 2017-11-11 DIAGNOSIS — E119 Type 2 diabetes mellitus without complications: Secondary | ICD-10-CM

## 2017-11-11 DIAGNOSIS — E781 Pure hyperglyceridemia: Secondary | ICD-10-CM

## 2017-11-11 DIAGNOSIS — N181 Chronic kidney disease, stage 1: Secondary | ICD-10-CM

## 2017-11-11 DIAGNOSIS — M1711 Unilateral primary osteoarthritis, right knee: Secondary | ICD-10-CM | POA: Diagnosis not present

## 2017-11-11 DIAGNOSIS — Z23 Encounter for immunization: Secondary | ICD-10-CM | POA: Diagnosis not present

## 2017-11-11 DIAGNOSIS — I10 Essential (primary) hypertension: Secondary | ICD-10-CM | POA: Diagnosis not present

## 2017-11-11 DIAGNOSIS — C189 Malignant neoplasm of colon, unspecified: Secondary | ICD-10-CM

## 2017-11-11 DIAGNOSIS — E785 Hyperlipidemia, unspecified: Secondary | ICD-10-CM

## 2017-11-11 LAB — BASIC METABOLIC PANEL
BUN: 21 mg/dL (ref 6–23)
CO2: 21 mEq/L (ref 19–32)
CREATININE: 1.3 mg/dL (ref 0.40–1.50)
Calcium: 9.9 mg/dL (ref 8.4–10.5)
Chloride: 105 mEq/L (ref 96–112)
GFR: 57.66 mL/min — AB (ref >60.00)
GLUCOSE: 242 mg/dL — AB (ref 70–99)
POTASSIUM: 4.6 meq/L (ref 3.5–5.1)
Sodium: 135 mEq/L (ref 135–145)

## 2017-11-11 LAB — LIPID PANEL
CHOLESTEROL: 109 mg/dL (ref 0–200)
HDL: 34.1 mg/dL — ABNORMAL LOW (ref >39.00)
NonHDL: 74.68
Total CHOL/HDL Ratio: 3
Triglycerides: 339 mg/dL — ABNORMAL HIGH (ref 0.0–149.0)
VLDL: 67.8 mg/dL — ABNORMAL HIGH (ref 0.0–40.0)

## 2017-11-11 LAB — HEMOGLOBIN A1C: Hgb A1c MFr Bld: 7.5 % — ABNORMAL HIGH (ref 4.6–6.5)

## 2017-11-11 LAB — LDL CHOLESTEROL, DIRECT: Direct LDL: 50 mg/dL

## 2017-11-11 MED ORDER — TURMERIC 500 MG PO CAPS: 1.0000 | ORAL_CAPSULE | Freq: Every day | ORAL | 1 refills | Status: AC

## 2017-11-11 MED ORDER — TURMERIC 500 MG PO CAPS: 1.0000 | ORAL_CAPSULE | Freq: Every day | ORAL | 1 refills | Status: DC

## 2017-11-11 NOTE — Progress Notes (Signed)
Subjective:  Patient ID: Dakota Abbott, male    DOB: 07-30-1945  Age: 72 y.o. MRN: [id number]  CC: Hyperlipidemia; Osteoarthritis; Hypertension; and Diabetes   HPI KHAMRON GELLERT presents for f/up - He complains of chronic right knee pain with activity.  He denies any recent trauma or injury.  He has not noticed any swelling.  He gets some symptom relief with the occasional dose of Aleve.  None of his other joints bother him.  He tells me his blood sugars are getting much better.  He denies any recent episodes of polys.  Outpatient Medications Prior to Visit  Medication Sig Dispense Refill  . amLODipine (NORVASC) 5 MG tablet TAKE 1 TABLET EVERY DAY 90 tablet 1  . aspirin (ASPIRIN LOW DOSE) 81 MG EC tablet Take 1 tablet (81 mg total) by mouth daily. 90 tablet 1  . atorvastatin (LIPITOR) 20 MG tablet TAKE 1 TABLET EVERY DAY 90 tablet 1  . blood glucose meter kit and supplies KIT Use to test blood up to twice daily. DX: E11.8 1 each 0  . cetirizine (ZYRTEC) 10 MG tablet Take 1 tablet (10 mg total) by mouth daily. (Patient not taking: Reported on 11/12/2017) 30 tablet 11  . Cholecalciferol 2000 units TABS Take 1 tablet (2,000 Units total) by mouth daily. 90 tablet 1  . dapagliflozin propanediol (FARXIGA) 10 MG TABS tablet Take 10 mg by mouth daily. 90 tablet 1  . gabapentin (NEURONTIN) 100 MG capsule TAKE 2 CAPSULES AT BEDTIME 180 capsule 0  . glucose blood (COOL BLOOD GLUCOSE TEST STRIPS) test strip Use to test blood sugar up to twice daily. DX: E11.8 200 each 3  . metFORMIN (GLUCOPHAGE-XR) 750 MG 24 hr tablet TAKE 2 TABLETS EVERY DAY WITH BREAKFAST 180 tablet 1  . Multiple Vitamins-Minerals (MULTIVITAMIN) tablet Take 1 tablet by mouth daily. 90 tablet 3  . olmesartan (BENICAR) 40 MG tablet TAKE 1 TABLET EVERY DAY 90 tablet 1  . TRUEPLUS LANCETS 30G MISC Use to test blood sugar up to twice daily. DX: E11.8 200 each 3  . Plecanatide (TRULANCE) 3 MG TABS Take 1 tablet by mouth daily. (Patient  not taking: Reported on 11/12/2017) 30 tablet 0   No facility-administered medications prior to visit.     ROS Review of Systems  Constitutional: Negative.  Negative for appetite change, diaphoresis, fatigue and unexpected weight change.  HENT: Negative.   Eyes: Negative for visual disturbance.  Respiratory: Negative for cough, chest tightness, shortness of breath and wheezing.   Cardiovascular: Negative for chest pain, palpitations and leg swelling.  Gastrointestinal: Negative for abdominal pain, constipation, diarrhea, nausea and vomiting.  Endocrine: Negative for polydipsia, polyphagia and polyuria.  Genitourinary: Negative.  Negative for difficulty urinating and dysuria.  Musculoskeletal: Positive for arthralgias. Negative for back pain and neck pain.  Skin: Negative.  Negative for color change, pallor and rash.  Neurological: Negative.  Negative for dizziness, weakness, light-headedness and headaches.  Hematological: Negative for adenopathy. Does not bruise/bleed easily.  Psychiatric/Behavioral: Negative.     Objective:  BP 122/70 (BP Location: Left Arm, Patient Position: Sitting, Cuff Size: Normal)   Pulse 72   Temp 98.7 F (37.1 C) (Oral)   Resp 16   Ht '5\' 10"'$  (1.778 m)   Wt 240 lb (108.9 kg)   SpO2 94%   BMI 34.44 kg/m   BP Readings from Last 3 Encounters:  11/11/17 122/70  07/20/17 114/68  05/27/17 140/84    Wt Readings from Last 3 Encounters:  11/12/17 239 lb 12.8 oz (108.8 kg)  11/11/17 240 lb (108.9 kg)  07/20/17 256 lb (116.1 kg)    Physical Exam  Constitutional: He is oriented to person, place, and time. No distress.  HENT:  Mouth/Throat: Oropharynx is clear and moist. No oropharyngeal exudate.  Eyes: Conjunctivae are normal. No scleral icterus.  Neck: Normal range of motion. Neck supple. No JVD present. No thyromegaly present.  Cardiovascular: Normal rate, regular rhythm and S1 normal. Exam reveals no gallop, no S3 and no S4.  Murmur heard.   Systolic murmur is present with a grade of 2/6.  No diastolic murmur is present. 2/6 SEM  Pulmonary/Chest: Effort normal and breath sounds normal. No respiratory distress. He has no wheezes. He has no rales.  Abdominal: Soft. Bowel sounds are normal. He exhibits no mass. There is no hepatosplenomegaly. There is no tenderness.  Musculoskeletal: Normal range of motion. He exhibits no edema or tenderness.       Right knee: He exhibits deformity (DJD). He exhibits normal range of motion, no swelling, no effusion, no ecchymosis, no erythema and no bony tenderness. No tenderness found.  Lymphadenopathy:    He has no cervical adenopathy.  Neurological: He is alert and oriented to person, place, and time.  Skin: Skin is warm and dry. No rash noted. He is not diaphoretic.  Vitals reviewed.   Lab Results  Component Value Date   WBC 7.5 05/27/2017   HGB 14.8 05/27/2017   HCT 41.4 05/27/2017   PLT 175.0 05/27/2017   GLUCOSE 242 (H) 11/11/2017   CHOL 109 11/11/2017   TRIG 339.0 (H) 11/11/2017   HDL 34.10 (L) 11/11/2017   LDLDIRECT 50.0 11/11/2017   LDLCALC 67 01/30/2013   ALT 32 05/27/2017   AST 27 05/27/2017   NA 135 11/11/2017   K 4.6 11/11/2017   CL 105 11/11/2017   CREATININE 1.30 11/11/2017   BUN 21 11/11/2017   CO2 21 11/11/2017   TSH 0.85 05/27/2017   PSA 2.66 05/27/2017   HGBA1C 7.5 (H) 11/11/2017   MICROALBUR 1.0 05/27/2017    Dg Lumbar Spine Complete  Result Date: 02/18/2016 CLINICAL DATA:  Low back pain, right leg pain for 2 weeks EXAM: LUMBAR SPINE - COMPLETE 4+ VIEW COMPARISON:  02/11/2016 FINDINGS: Five views of the lumbar spine submitted. No acute fracture or subluxation. There is disc space flattening with vacuum disc phenomenon at T12-L1 and L1-L2 level. Mild anterior spurring upper and lower endplate of L1 and upper endplate of L2 vertebral body. Mild disc space flattening at L5-S1 level. Mild facet degenerative changes L5 level. IMPRESSION: No acute fracture or  subluxation. Degenerative changes as described above. Electronically Signed   By: Lahoma Crocker M.D.   On: 02/18/2016 15:27    Assessment & Plan:   Vasil was seen today for hyperlipidemia, osteoarthritis, hypertension and diabetes.  Diagnoses and all orders for this visit:  Malignant neoplasm of colon, unspecified part of colon ([place])- His CEA level is normal which is reassuring that he does not have a recurrence of colon cancer. -     CEA; Future  Chronic renal impairment, stage 1- His renal function is normal.  I have asked him to avoid nephrotoxic agents.  Will continue to control his risk factors with blood sugar and blood pressure control. -     Basic metabolic panel; Future  Essential hypertension- His blood pressure is well controlled.  Electrolytes and renal function are normal. -     Basic metabolic panel; Future  Pure hyperglyceridemia- His triglycerides remain elevated despite lifestyle modifications.  I have asked him to start taking VASCEPA to lower his triglycerides and to reduce his cardiovascular risk. -     Lipid panel; Future -     Icosapent Ethyl (VASCEPA) 1 g CAPS; Take 2 capsules (2 g total) by mouth 2 (two) times daily.  Controlled type 2 diabetes mellitus without complication, without long-term current use of insulin ([place])- His A1c is down to 7.5% which is an improvement.  Will continue the current medications and I have asked him to improve his lifestyle modifications. -     Basic metabolic panel; Future -     Hemoglobin A1c; Future  Hyperlipidemia LDL goal <100- He has achieved his LDL goal and is doing well on the statin. -     Lipid panel; Future  Primary osteoarthritis of right knee- I have asked him to avoid NSAIDs due to his history of renal insufficiency and to try to control the pain with turmeric. -     Discontinue: Turmeric 500 MG CAPS; Take 1 capsule by mouth daily. -     Turmeric 500 MG CAPS; Take 1 capsule by mouth daily.  Need for Tdap vaccination -      Tdap vaccine greater than or equal to 7yo IM   I have discontinued Jeneen Rinks A. Lunde "Jim"'s Plecanatide. I am also having him start on Icosapent Ethyl. Additionally, I am having him maintain his cetirizine, blood glucose meter kit and supplies, TRUEPLUS LANCETS 30G, glucose blood, multivitamin, Cholecalciferol, aspirin, dapagliflozin propanediol, olmesartan, atorvastatin, amLODipine, gabapentin, metFORMIN, and Turmeric.  Meds ordered this encounter  Medications  . DISCONTD: Turmeric 500 MG CAPS    Sig: Take 1 capsule by mouth daily.    Dispense:  90 capsule    Refill:  1  . Turmeric 500 MG CAPS    Sig: Take 1 capsule by mouth daily.    Dispense:  90 capsule    Refill:  1  . Icosapent Ethyl (VASCEPA) 1 g CAPS    Sig: Take 2 capsules (2 g total) by mouth 2 (two) times daily.    Dispense:  360 capsule    Refill:  1     Follow-up: Return in about 6 months (around 05/12/2018).  Scarlette Calico, MD

## 2017-11-11 NOTE — Patient Instructions (Signed)

## 2017-11-12 ENCOUNTER — Ambulatory Visit (AMBULATORY_SURGERY_CENTER): Payer: Self-pay

## 2017-11-12 ENCOUNTER — Encounter: Payer: Medicare HMO | Admitting: Gastroenterology

## 2017-11-12 VITALS — Ht 70.0 in | Wt 239.8 lb

## 2017-11-12 DIAGNOSIS — Z86010 Personal history of colonic polyps: Secondary | ICD-10-CM

## 2017-11-12 DIAGNOSIS — Z85038 Personal history of other malignant neoplasm of large intestine: Secondary | ICD-10-CM

## 2017-11-12 LAB — CEA: CEA: 1 ng/mL

## 2017-11-12 MED ORDER — PEG-KCL-NACL-NASULF-NA ASC-C 140 G PO SOLR: 1.0000 | Freq: Once | ORAL | Status: AC

## 2017-11-12 NOTE — Progress Notes (Signed)
Per pt, no allergies to soy or egg products.Pt not taking any weight loss meds or using  O2 at home.  Pt refused emmi video. 

## 2017-11-13 ENCOUNTER — Encounter: Payer: Self-pay | Admitting: Internal Medicine

## 2017-11-13 MED ORDER — ICOSAPENT ETHYL 1 G PO CAPS: 2.0000 | ORAL_CAPSULE | Freq: Two times a day (BID) | ORAL | 1 refills | Status: DC

## 2017-11-14 ENCOUNTER — Encounter: Payer: Self-pay | Admitting: Internal Medicine

## 2017-11-15 ENCOUNTER — Other Ambulatory Visit: Payer: Self-pay | Admitting: Internal Medicine

## 2017-11-15 DIAGNOSIS — E781 Pure hyperglyceridemia: Secondary | ICD-10-CM

## 2017-11-15 MED ORDER — OMEGA-3-ACID ETHYL ESTERS 1 G PO CAPS: 2.0000 g | ORAL_CAPSULE | Freq: Two times a day (BID) | ORAL | 1 refills | Status: DC

## 2017-11-25 ENCOUNTER — Encounter: Payer: Self-pay | Admitting: Internal Medicine

## 2017-11-25 ENCOUNTER — Other Ambulatory Visit: Payer: Self-pay | Admitting: Internal Medicine

## 2017-11-25 DIAGNOSIS — E781 Pure hyperglyceridemia: Secondary | ICD-10-CM

## 2017-11-25 MED ORDER — OMEGA-3-ACID ETHYL ESTERS 1 G PO CAPS: 2.0000 g | ORAL_CAPSULE | Freq: Two times a day (BID) | ORAL | 1 refills | Status: DC

## 2017-11-26 ENCOUNTER — Telehealth: Payer: Self-pay | Admitting: Gastroenterology

## 2017-11-26 NOTE — Telephone Encounter (Signed)
Called pt and left message to call our office. Dakota Abbott

## 2017-11-26 NOTE — Telephone Encounter (Signed)
Wife states Plenvu is $140.50- pt has [employer]- I offered a PLenvu sample to pt- Wife will pick it up Tuesday while she is in the building seeing her PCP - Wife very appreciative of sample- she was instructed to pick up hte prep 3rd floor desk- she verbalized understanding  Lot [id number]  Exp 08/2018 as directed   Lelan Pons pV

## 2017-11-26 NOTE — Telephone Encounter (Signed)
[facility] called to inform that copay for plenvu is more than what pt can afford. They want to know if pt can have something different.

## 2017-12-15 ENCOUNTER — Encounter: Payer: Self-pay | Admitting: Gastroenterology

## 2017-12-21 LAB — HM COLONOSCOPY

## 2017-12-22 ENCOUNTER — Encounter: Payer: Self-pay | Admitting: Gastroenterology

## 2017-12-22 ENCOUNTER — Ambulatory Visit (AMBULATORY_SURGERY_CENTER): Payer: Medicare HMO | Admitting: Gastroenterology

## 2017-12-22 VITALS — BP 115/66 | HR 70 | Temp 96.8°F | Resp 10 | Ht 70.0 in | Wt 239.0 lb

## 2017-12-22 DIAGNOSIS — D123 Benign neoplasm of transverse colon: Secondary | ICD-10-CM | POA: Diagnosis not present

## 2017-12-22 DIAGNOSIS — D122 Benign neoplasm of ascending colon: Secondary | ICD-10-CM | POA: Diagnosis not present

## 2017-12-22 DIAGNOSIS — D128 Benign neoplasm of rectum: Secondary | ICD-10-CM | POA: Diagnosis not present

## 2017-12-22 DIAGNOSIS — Z85038 Personal history of other malignant neoplasm of large intestine: Secondary | ICD-10-CM

## 2017-12-22 DIAGNOSIS — K621 Rectal polyp: Secondary | ICD-10-CM | POA: Diagnosis not present

## 2017-12-22 DIAGNOSIS — Z86010 Personal history of colonic polyps: Secondary | ICD-10-CM | POA: Diagnosis not present

## 2017-12-22 LAB — HM COLONOSCOPY

## 2017-12-22 MED ORDER — SODIUM CHLORIDE 0.9 % IV SOLN: 500.0000 mL | Freq: Once | INTRAVENOUS | Status: DC

## 2017-12-22 NOTE — Op Note (Signed)
[facility] Patient Name: Dakota Abbott Procedure Date: 12/22/2017 8:38 AM MRN: [id number] Endoscopist: Cochrane. Loletha Carrow , MD Age: 72 Referring MD:  Date of Birth: November 29, 1945 Gender: Male Account #: [id number] Procedure:                Colonoscopy Indications:              Personal history of colon cancer (sigmoid resection                            2008, colonoscopy 2011, no polyps on 09/2012                            colonoscopy) Medicines:                Monitored Anesthesia Care Procedure:                Pre-Anesthesia Assessment:                           - Prior to the procedure, a History and Physical                            was performed, and patient medications and                            allergies were reviewed. The patient's tolerance of                            previous anesthesia was also reviewed. The risks                            and benefits of the procedure and the sedation                            options and risks were discussed with the patient.                            All questions were answered, and informed consent                            was obtained. Anticoagulants: The patient has taken                            aspirin. It was decided not to withhold this                            medication prior to the procedure. ASA Grade                            Assessment: II - A patient with mild systemic                            disease. After reviewing the risks and benefits,  the patient was deemed in satisfactory condition to                            undergo the procedure.                           After obtaining informed consent, the colonoscope                            was passed under direct vision. Throughout the                            procedure, the patient's blood pressure, pulse, and                            oxygen saturations were monitored continuously. The                             Colonoscope was introduced through the anus and                            advanced to the the cecum, identified by                            appendiceal orifice and ileocecal valve. The                            colonoscopy was performed without difficulty. The                            patient tolerated the procedure well. The quality                            of the bowel preparation was good. The ileocecal                            valve, appendiceal orifice, and rectum were                            photographed. The bowel preparation used was Plenvu. Scope In: 8:51:12 AM Scope Out: 9:12:47 AM Scope Withdrawal Time: 0 hours 19 minutes 21 seconds  Total Procedure Duration: 0 hours 21 minutes 35 seconds  Findings:                 The perianal and digital rectal examinations were                            normal.                           Three sessile polyps were found in the ascending                            colon. The polyps were 3 to 8 mm in size. These  polyps were removed with a cold snare. Resection                            and retrieval were complete. (Bottle 1)                           Three sessile polyps were found in the rectum(2)                            and transverse colon(1). The polyps were 3 to 5 mm                            in size. These polyps were removed with a cold                            snare. Resection and retrieval were complete.                            (Bottle 2)                           There was evidence of a prior end-to-side                            colo-rectal anastomosis in the proximal rectum.                            This was patent and was characterized by healthy                            appearing mucosa.                           The exam was otherwise without abnormality on                            direct and retroflexion views. Complications:            No immediate  complications. Estimated Blood Loss:     Estimated blood loss was minimal. Impression:               - Three 3 to 8 mm polyps in the ascending colon,                            removed with a cold snare. Resected and retrieved.                           - Three 3 to 5 mm polyps in the rectum and in the                            transverse colon, removed with a cold snare.                            Resected and retrieved.                           -  Patent end-to-side colo-rectal anastomosis,                            characterized by healthy appearing mucosa.                           - The examination was otherwise normal on direct                            and retroflexion views. Recommendation:           - Patient has a contact number available for                            emergencies. The signs and symptoms of potential                            delayed complications were discussed with the                            patient. Return to normal activities tomorrow.                            Written discharge instructions were provided to the                            patient.                           - Resume previous diet.                           - Continue present medications.                           - Await pathology results.                           - Repeat colonoscopy is recommended for                            surveillance. The colonoscopy date will be                            determined after pathology results from today's                            exam become available for review. Taylen Wendland L. Loletha Carrow, MD 12/22/2017 9:20:25 AM This report has been signed electronically.

## 2017-12-22 NOTE — Progress Notes (Signed)
Pt's states no medical or surgical changes since previsit or office visit. 

## 2017-12-22 NOTE — Progress Notes (Signed)
Called to room to assist during endoscopic procedure.  Patient ID and intended procedure confirmed with present staff. Received instructions for my participation in the procedure from the performing physician.  

## 2017-12-22 NOTE — Patient Instructions (Signed)
  Information given on polyps to you today.  Await pathology results.  YOU HAD AN ENDOSCOPIC PROCEDURE TODAY AT [facility]:   Refer to the procedure report that was given to you for any specific questions about what was found during the examination.  If the procedure report does not answer your questions, please call your gastroenterologist to clarify.  If you requested that your care partner not be given the details of your procedure findings, then the procedure report has been included in a sealed envelope for you to review at your convenience later.  YOU SHOULD EXPECT: Some feelings of bloating in the abdomen. Passage of more gas than usual.  Walking can help get rid of the air that was put into your GI tract during the procedure and reduce the bloating. If you had a lower endoscopy (such as a colonoscopy or flexible sigmoidoscopy) you may notice spotting of blood in your stool or on the toilet paper. If you underwent a bowel prep for your procedure, you may not have a normal bowel movement for a few days.  Please Note:  You might notice some irritation and congestion in your nose or some drainage.  This is from the oxygen used during your procedure.  There is no need for concern and it should clear up in a day or so.  SYMPTOMS TO REPORT IMMEDIATELY:   Following lower endoscopy (colonoscopy or flexible sigmoidoscopy):  Excessive amounts of blood in the stool  Significant tenderness or worsening of abdominal pains  Swelling of the abdomen that is new, acute  Fever of 100F or higher   For urgent or emergent issues, a gastroenterologist can be reached at any hour by calling [phone].   DIET:  We do recommend a small meal at first, but then you may proceed to your regular diet.  Drink plenty of fluids but you should avoid alcoholic beverages for 24 hours.  ACTIVITY:  You should plan to take it easy for the rest of today and you should NOT DRIVE or use heavy machinery  until tomorrow (because of the sedation medicines used during the test).    FOLLOW UP: Our staff will call the number listed on your records the next business day following your procedure to check on you and address any questions or concerns that you may have regarding the information given to you following your procedure. If we do not reach you, we will leave a message.  However, if you are feeling well and you are not experiencing any problems, there is no need to return our call.  We will assume that you have returned to your regular daily activities without incident.  If any biopsies were taken you will be contacted by phone or by letter within the next 1-3 weeks.  Please call us at [phone] if you have not heard about the biopsies in 3 weeks.    SIGNATURES/CONFIDENTIALITY: You and/or your care partner have signed paperwork which will be entered into your electronic medical record.  These signatures attest to the fact that that the information above on your After Visit Summary has been reviewed and is understood.  Full responsibility of the confidentiality of this discharge information lies with you and/or your care-partner.

## 2017-12-22 NOTE — Progress Notes (Signed)
Pt awkae. VSS. Report given to Rn, no anesthestic complications noted

## 2017-12-23 ENCOUNTER — Telehealth: Payer: Self-pay | Admitting: *Deleted

## 2017-12-23 NOTE — Telephone Encounter (Signed)
  Follow up Call-  Call back number 12/22/2017  Post procedure Call Back phone  # [phone]  Permission to leave phone message Yes  Some recent data might be hidden     Patient questions:  Do you have a fever, pain , or abdominal swelling? No. Pain Score  0 *  Have you tolerated food without any problems? Yes.    Have you been able to return to your normal activities? Yes.    Do you have any questions about your discharge instructions: Diet   No. Medications  No. Follow up visit  No.  Do you have questions or concerns about your Care? No.  Actions: * If pain score is 4 or above: No action needed, pain <4.

## 2017-12-27 ENCOUNTER — Encounter: Payer: Self-pay | Admitting: Internal Medicine

## 2017-12-28 ENCOUNTER — Encounter: Payer: Self-pay | Admitting: Internal Medicine

## 2017-12-28 ENCOUNTER — Ambulatory Visit (INDEPENDENT_AMBULATORY_CARE_PROVIDER_SITE_OTHER): Payer: Medicare HMO | Admitting: Internal Medicine

## 2017-12-28 DIAGNOSIS — M25562 Pain in left knee: Secondary | ICD-10-CM | POA: Diagnosis not present

## 2017-12-28 DIAGNOSIS — M25511 Pain in right shoulder: Secondary | ICD-10-CM | POA: Diagnosis not present

## 2017-12-28 DIAGNOSIS — M25561 Pain in right knee: Secondary | ICD-10-CM | POA: Diagnosis not present

## 2017-12-28 MED ORDER — TRIAMCINOLONE ACETONIDE 0.1 % EX CREA: 1.0000 "application " | TOPICAL_CREAM | Freq: Two times a day (BID) | CUTANEOUS | 0 refills | Status: DC

## 2017-12-28 MED ORDER — PREDNISONE 20 MG PO TABS: 40.0000 mg | ORAL_TABLET | Freq: Every day | ORAL | 0 refills | Status: DC

## 2017-12-28 NOTE — Assessment & Plan Note (Signed)
Rx for prednisone burst. Can continue aleve. No indication for imaging today. Return with sports medicine if no improvement for injections.

## 2017-12-28 NOTE — Patient Instructions (Signed)
We have sent in the prednisone to take 2 pills daily for 5 days.  We have sent in the cream to use for rash if needed.

## 2017-12-28 NOTE — Assessment & Plan Note (Signed)
Rx for prednisone dose pack and continue aleve for pain as well as ice. He will work on more exercise and follow up with sports medicine if no improvement.

## 2017-12-28 NOTE — Progress Notes (Signed)
   Subjective:   Patient ID: Dakota Abbott, male    DOB: 06-17-1945, 72 y.o.   MRN: [id number]  HPI The patient is a 72 YO man coming in for right shoulder pain (has arthritis in the shoulder, has taken prednisone rarely for pain, good for the last several years, worse in the last year or so, worse in the last several weeks as well with the cold, has been taking aleve which helps some, uses heating pad which does not help much, denies injury or overuse), right knee pain (has arthritis in the knee, denies injury or overuse, has been taking aleve, this helps but not for long, worse with prolonged walking or stairs) and left knee pain (has had arthritis for years and worse this year and especially in the last several weeks, taking aleve, denies falls or injury, denies swelling or redness of knee, overall 4/10 pain, worsening).   Review of Systems  Constitutional: Negative.   HENT: Negative.   Eyes: Negative.   Respiratory: Negative for cough, chest tightness and shortness of breath.   Cardiovascular: Negative for chest pain, palpitations and leg swelling.  Gastrointestinal: Negative for abdominal distention, abdominal pain, constipation, diarrhea, nausea and vomiting.  Musculoskeletal: Positive for arthralgias and myalgias. Negative for back pain, gait problem and joint swelling.  Skin: Negative.   Neurological: Negative.   Psychiatric/Behavioral: Negative.     Objective:  Physical Exam Constitutional:      Appearance: He is well-developed.  HENT:     Head: Normocephalic and atraumatic.  Neck:     Musculoskeletal: Normal range of motion.  Cardiovascular:     Rate and Rhythm: Normal rate and regular rhythm.  Pulmonary:     Effort: Pulmonary effort is normal. No respiratory distress.     Breath sounds: Normal breath sounds. No wheezing or rales.  Abdominal:     General: Bowel sounds are normal. There is no distension.     Palpations: Abdomen is soft.     Tenderness: There is no  abdominal tenderness. There is no rebound.  Musculoskeletal:        General: Tenderness present.     Comments: Minimal tenderness right shoulder to palpation, knees not tender or red or swollen on exam bilaterally.   Skin:    General: Skin is warm and dry.  Neurological:     Mental Status: He is alert and oriented to person, place, and time.     Coordination: Coordination normal.     Vitals:   12/28/17 1047  BP: 100/60  Pulse: 69  Temp: 97.6 F (36.4 C)  TempSrc: Oral  SpO2: 96%  Weight: 240 lb (108.9 kg)  Height: 5\' 10"  (1.778 m)    Assessment & Plan:

## 2018-01-03 ENCOUNTER — Other Ambulatory Visit: Payer: Self-pay | Admitting: Internal Medicine

## 2018-01-03 ENCOUNTER — Encounter: Payer: Medicare HMO | Admitting: Gastroenterology

## 2018-01-06 ENCOUNTER — Encounter: Payer: Self-pay | Admitting: Gastroenterology

## 2018-01-06 ENCOUNTER — Encounter: Payer: Self-pay | Admitting: Internal Medicine

## 2018-01-24 ENCOUNTER — Other Ambulatory Visit: Payer: Self-pay | Admitting: Internal Medicine

## 2018-01-24 DIAGNOSIS — E119 Type 2 diabetes mellitus without complications: Secondary | ICD-10-CM

## 2018-01-24 DIAGNOSIS — E785 Hyperlipidemia, unspecified: Secondary | ICD-10-CM

## 2018-01-24 DIAGNOSIS — I10 Essential (primary) hypertension: Secondary | ICD-10-CM

## 2018-01-26 ENCOUNTER — Other Ambulatory Visit: Payer: Self-pay | Admitting: Internal Medicine

## 2018-01-26 DIAGNOSIS — Z0000 Encounter for general adult medical examination without abnormal findings: Secondary | ICD-10-CM

## 2018-02-21 ENCOUNTER — Encounter: Payer: Self-pay | Admitting: Internal Medicine

## 2018-03-07 ENCOUNTER — Other Ambulatory Visit: Payer: Self-pay | Admitting: Internal Medicine

## 2018-03-07 DIAGNOSIS — I10 Essential (primary) hypertension: Secondary | ICD-10-CM

## 2018-03-16 ENCOUNTER — Encounter: Payer: Self-pay | Admitting: Internal Medicine

## 2018-04-20 ENCOUNTER — Other Ambulatory Visit: Payer: Self-pay | Admitting: Internal Medicine

## 2018-04-20 DIAGNOSIS — I10 Essential (primary) hypertension: Secondary | ICD-10-CM

## 2018-04-21 ENCOUNTER — Other Ambulatory Visit: Payer: Self-pay | Admitting: Internal Medicine

## 2018-04-21 DIAGNOSIS — L2084 Intrinsic (allergic) eczema: Secondary | ICD-10-CM

## 2018-04-21 MED ORDER — TRIAMCINOLONE ACETONIDE 0.1 % EX CREA: 1.0000 "application " | TOPICAL_CREAM | Freq: Two times a day (BID) | CUTANEOUS | 1 refills | Status: AC

## 2018-05-23 ENCOUNTER — Encounter: Payer: Self-pay | Admitting: Internal Medicine

## 2018-06-08 ENCOUNTER — Encounter: Payer: Self-pay | Admitting: Internal Medicine

## 2018-06-08 ENCOUNTER — Other Ambulatory Visit: Payer: Self-pay

## 2018-06-08 ENCOUNTER — Other Ambulatory Visit (INDEPENDENT_AMBULATORY_CARE_PROVIDER_SITE_OTHER): Payer: Medicare HMO

## 2018-06-08 ENCOUNTER — Ambulatory Visit (INDEPENDENT_AMBULATORY_CARE_PROVIDER_SITE_OTHER): Payer: Medicare HMO | Admitting: Internal Medicine

## 2018-06-08 VITALS — BP 124/70 | HR 84 | Temp 98.9°F | Resp 16 | Ht 70.0 in | Wt 231.0 lb

## 2018-06-08 DIAGNOSIS — E559 Vitamin D deficiency, unspecified: Secondary | ICD-10-CM | POA: Diagnosis not present

## 2018-06-08 DIAGNOSIS — N400 Enlarged prostate without lower urinary tract symptoms: Secondary | ICD-10-CM | POA: Diagnosis not present

## 2018-06-08 DIAGNOSIS — I10 Essential (primary) hypertension: Secondary | ICD-10-CM | POA: Diagnosis not present

## 2018-06-08 DIAGNOSIS — R972 Elevated prostate specific antigen [PSA]: Secondary | ICD-10-CM | POA: Diagnosis not present

## 2018-06-08 DIAGNOSIS — E781 Pure hyperglyceridemia: Secondary | ICD-10-CM | POA: Diagnosis not present

## 2018-06-08 DIAGNOSIS — E119 Type 2 diabetes mellitus without complications: Secondary | ICD-10-CM

## 2018-06-08 DIAGNOSIS — N183 Chronic kidney disease, stage 3 unspecified: Secondary | ICD-10-CM

## 2018-06-08 DIAGNOSIS — E785 Hyperlipidemia, unspecified: Secondary | ICD-10-CM

## 2018-06-08 DIAGNOSIS — N181 Chronic kidney disease, stage 1: Secondary | ICD-10-CM

## 2018-06-08 DIAGNOSIS — E1122 Type 2 diabetes mellitus with diabetic chronic kidney disease: Secondary | ICD-10-CM | POA: Diagnosis not present

## 2018-06-08 LAB — COMPREHENSIVE METABOLIC PANEL
ALT: 26 U/L (ref 0–53)
AST: 27 U/L (ref 0–37)
Albumin: 4.5 g/dL (ref 3.5–5.2)
Alkaline Phosphatase: 43 U/L (ref 39–117)
BUN: 36 mg/dL — ABNORMAL HIGH (ref 6–23)
CO2: 21 mEq/L (ref 19–32)
Calcium: 10 mg/dL (ref 8.4–10.5)
Chloride: 107 mEq/L (ref 96–112)
Creatinine, Ser: 1.64 mg/dL — ABNORMAL HIGH (ref 0.40–1.50)
GFR: 41.42 mL/min — ABNORMAL LOW (ref >60.00)
Glucose, Bld: 116 mg/dL — ABNORMAL HIGH (ref 70–99)
Potassium: 4.9 mEq/L (ref 3.5–5.1)
Sodium: 138 mEq/L (ref 135–145)
Total Bilirubin: 0.8 mg/dL (ref 0.2–1.2)
Total Protein: 7 g/dL (ref 6.0–8.3)

## 2018-06-08 LAB — CBC WITH DIFFERENTIAL/PLATELET
Basophils Absolute: 0.1 10*3/uL (ref 0.0–0.1)
Basophils Relative: 1.1 % (ref 0.0–3.0)
Eosinophils Absolute: 0.3 10*3/uL (ref 0.0–0.7)
Eosinophils Relative: 3.5 % (ref 0.0–5.0)
HCT: 43.5 % (ref 39.0–52.0)
Hemoglobin: 15.5 g/dL (ref 13.0–17.0)
Lymphocytes Relative: 29.4 % (ref 12.0–46.0)
Lymphs Abs: 2.2 10*3/uL (ref 0.7–4.0)
MCHC: 35.6 g/dL (ref 30.0–36.0)
MCV: 91.4 fl (ref 78.0–100.0)
Monocytes Absolute: 0.6 10*3/uL (ref 0.1–1.0)
Monocytes Relative: 7.9 % (ref 3.0–12.0)
Neutro Abs: 4.4 10*3/uL (ref 1.4–7.7)
Neutrophils Relative %: 58.1 % (ref 43.0–77.0)
Platelets: 187 10*3/uL (ref 150.0–400.0)
RBC: 4.76 Mil/uL (ref 4.22–5.81)
RDW: 12.9 % (ref 11.5–15.5)
WBC: 7.6 10*3/uL (ref 4.0–10.5)

## 2018-06-08 LAB — LIPID PANEL
Cholesterol: 113 mg/dL (ref 0–200)
HDL: 32.3 mg/dL — ABNORMAL LOW (ref >39.00)
NonHDL: 81.11
Total CHOL/HDL Ratio: 4
Triglycerides: 284 mg/dL — ABNORMAL HIGH (ref 0.0–149.0)
VLDL: 56.8 mg/dL — ABNORMAL HIGH (ref 0.0–40.0)

## 2018-06-08 LAB — LDL CHOLESTEROL, DIRECT: Direct LDL: 48 mg/dL

## 2018-06-08 LAB — PSA: PSA: 4.17 ng/mL — ABNORMAL HIGH (ref 0.10–4.00)

## 2018-06-08 LAB — HEMOGLOBIN A1C: Hgb A1c MFr Bld: 7 % — ABNORMAL HIGH (ref 4.6–6.5)

## 2018-06-08 LAB — VITAMIN D 25 HYDROXY (VIT D DEFICIENCY, FRACTURES): VITD: 41.16 ng/mL (ref 30.00–100.00)

## 2018-06-08 LAB — TSH: TSH: 1.1 u[IU]/mL (ref 0.35–4.50)

## 2018-06-08 MED ORDER — OMEGA-3-ACID ETHYL ESTERS 1 G PO CAPS: 2.0000 g | ORAL_CAPSULE | Freq: Two times a day (BID) | ORAL | 1 refills | Status: AC

## 2018-06-08 NOTE — Progress Notes (Signed)
Subjective:  Patient ID: Dakota Abbott, male    DOB: 07/14/1945  Age: 73 y.o. MRN: [id number]  CC: Hypertension; Hyperlipidemia; and Diabetes   HPI Dakota Abbott presents for f/up - He tells me his blood pressure and blood sugars have been well controlled recently.  He cuts his neighbors grass using a push mower and does not experience CP, DOE, palpitations, edema, fatigue, or claudication.  Outpatient Medications Prior to Visit  Medication Sig Dispense Refill  . amLODipine (NORVASC) 5 MG tablet TAKE 1 TABLET (5 MG TOTAL) BY MOUTH DAILY. 90 tablet 1  . ASPIRIN LOW DOSE 81 MG EC tablet TAKE 1 TABLET(81 MG) BY MOUTH DAILY 90 tablet 1  . atorvastatin (LIPITOR) 20 MG tablet TAKE 1 TABLET EVERY DAY 90 tablet 1  . blood glucose meter kit and supplies KIT Use to test blood up to twice daily. DX: E11.8 1 each 0  . cetirizine (ZYRTEC) 10 MG tablet Take 1 tablet (10 mg total) by mouth daily. 30 tablet 11  . Cholecalciferol 2000 units TABS Take 1 tablet (2,000 Units total) by mouth daily. 90 tablet 1  . dapagliflozin propanediol (FARXIGA) 10 MG TABS tablet Take 10 mg by mouth daily. 90 tablet 1  . gabapentin (NEURONTIN) 100 MG capsule TAKE 2 CAPSULES AT BEDTIME 180 capsule 1  . glucose blood (COOL BLOOD GLUCOSE TEST STRIPS) test strip Use to test blood sugar up to twice daily. DX: E11.8 200 each 3  . metFORMIN (GLUCOPHAGE-XR) 750 MG 24 hr tablet TAKE 2 TABLETS EVERY DAY WITH BREAKFAST 180 tablet 1  . Multiple Vitamins-Minerals (MULTIVITAMIN) tablet Take 1 tablet by mouth daily. 90 tablet 3  . olmesartan (BENICAR) 40 MG tablet TAKE 1 TABLET EVERY DAY 90 tablet 1  . triamcinolone cream (KENALOG) 0.1 % Apply 1 application topically 2 (two) times daily. 100 g 1  . TRUEPLUS LANCETS 30G MISC Use to test blood sugar up to twice daily. DX: E11.8 200 each 3  . Turmeric 500 MG CAPS Take 1 capsule by mouth daily. 90 capsule 1  . omega-3 acid ethyl esters (LOVAZA) 1 g capsule Take 2 capsules (2 g total) by  mouth 2 (two) times daily. 360 capsule 1   No facility-administered medications prior to visit.     ROS Review of Systems  Constitutional: Negative for diaphoresis, fatigue and unexpected weight change.  HENT: Negative.  Negative for trouble swallowing.   Eyes: Negative for visual disturbance.  Respiratory: Negative for cough, chest tightness, shortness of breath and wheezing.   Cardiovascular: Negative for chest pain, palpitations and leg swelling.  Gastrointestinal: Negative for abdominal pain, diarrhea, nausea and vomiting.  Endocrine: Negative.   Genitourinary: Negative.  Negative for difficulty urinating.  Musculoskeletal: Negative for arthralgias and myalgias.  Skin: Negative.  Negative for color change.  Neurological: Negative.  Negative for dizziness and light-headedness.  Hematological: Negative for adenopathy. Does not bruise/bleed easily.  Psychiatric/Behavioral: Negative.     Objective:  BP 124/70 (BP Location: Left Arm, Patient Position: Sitting, Cuff Size: Normal)   Pulse 84   Temp 98.9 F (37.2 C) (Oral)   Resp 16   Ht _0  (1.778 m)   Wt 231 lb (104.8 kg)   SpO2 94%   BMI 33.15 kg/m   BP Readings from Last 3 Encounters:  06/08/18 124/70  12/28/17 100/60  12/22/17 115/66    Wt Readings from Last 3 Encounters:  06/08/18 231 lb (104.8 kg)  12/28/17 240 lb (108.9 kg)  12/22/17 239  lb (108.4 kg)    Physical Exam Vitals signs reviewed.  Constitutional:      Appearance: He is obese. He is not ill-appearing or diaphoretic.  HENT:     Nose: Nose normal. No congestion or rhinorrhea.     Mouth/Throat:     Mouth: Mucous membranes are moist.     Pharynx: No oropharyngeal exudate.  Eyes:     General: No scleral icterus.    Conjunctiva/sclera: Conjunctivae normal.  Neck:     Musculoskeletal: Normal range of motion. No neck rigidity.  Cardiovascular:     Rate and Rhythm: Normal rate and regular rhythm.     Heart sounds: Murmur present. Systolic murmur  present with a grade of 1/6. No diastolic murmur. No gallop.   Pulmonary:     Breath sounds: No stridor. No wheezing, rhonchi or rales.  Abdominal:     General: Abdomen is flat and protuberant. Bowel sounds are normal.     Palpations: There is no hepatomegaly, splenomegaly or mass.     Tenderness: There is no abdominal tenderness.     Hernia: No hernia is present.  Musculoskeletal: Normal range of motion.     Right lower leg: No edema.     Left lower leg: No edema.  Lymphadenopathy:     Cervical: No cervical adenopathy.  Skin:    General: Skin is warm and dry.  Neurological:     General: No focal deficit present.     Mental Status: Mental status is at baseline.  Psychiatric:        Mood and Affect: Mood normal.        Behavior: Behavior normal.     Lab Results  Component Value Date   WBC 7.6 06/08/2018   HGB 15.5 06/08/2018   HCT 43.5 06/08/2018   PLT 187.0 06/08/2018   GLUCOSE 116 (H) 06/08/2018   CHOL 113 06/08/2018   TRIG 284.0 (H) 06/08/2018   HDL 32.30 (L) 06/08/2018   LDLDIRECT 48.0 06/08/2018   LDLCALC 67 01/30/2013   ALT 26 06/08/2018   AST 27 06/08/2018   NA 138 06/08/2018   K 4.9 06/08/2018   CL 107 06/08/2018   CREATININE 1.64 (H) 06/08/2018   BUN 36 (H) 06/08/2018   CO2 21 06/08/2018   TSH 1.10 06/08/2018   PSA 4.17 (H) 06/08/2018   HGBA1C 7.0 (H) 06/08/2018   MICROALBUR 1.0 05/27/2017    Dg Lumbar Spine Complete  Result Date: 02/18/2016 CLINICAL DATA:  Low back pain, right leg pain for 2 weeks EXAM: LUMBAR SPINE - COMPLETE 4+ VIEW COMPARISON:  02/11/2016 FINDINGS: Five views of the lumbar spine submitted. No acute fracture or subluxation. There is disc space flattening with vacuum disc phenomenon at T12-L1 and L1-L2 level. Mild anterior spurring upper and lower endplate of L1 and upper endplate of L2 vertebral body. Mild disc space flattening at L5-S1 level. Mild facet degenerative changes L5 level. IMPRESSION: No acute fracture or subluxation.  Degenerative changes as described above. Electronically Signed   By: Lahoma Crocker M.D.   On: 02/18/2016 15:27    Assessment & Plan:   Ismar was seen today for hypertension, hyperlipidemia and diabetes.  Diagnoses and all orders for this visit:  Essential hypertension- His blood pressure is adequately well controlled. -     CBC with Differential/Platelet; Future -     TSH; Future -     Urinalysis, Routine w reflex microscopic; Future  Controlled type 2 diabetes mellitus without complication, without long-term current use of  insulin ([place])- His blood sugars are well controlled. -     Hemoglobin A1c; Future -     Microalbumin / creatinine urine ratio; Future -     HM Diabetes Foot Exam -     Ambulatory referral to Ophthalmology  Benign prostatic hyperplasia without lower urinary tract symptoms- His PSA is nearly doubled over the last year but he is asymptomatic. -     PSA; Future  Chronic renal impairment, stage 1 -     Comprehensive metabolic panel; Future -     Urinalysis, Routine w reflex microscopic; Future  Hyperlipidemia LDL goal <100- He has achieved his LDL goal and is doing well on the statin. -     Lipid panel; Future -     TSH; Future  Pure hyperglyceridemia- His triglycerides remain mildly elevated.  I have asked him to be more compliant with the omega-3 fish oil and lifestyle modifications. -     Lipid panel; Future -     omega-3 acid ethyl esters (LOVAZA) 1 g capsule; Take 2 capsules (2 g total) by mouth 2 (two) times daily.  Vitamin D deficiency -     VITAMIN D 25 Hydroxy (Vit-D Deficiency, Fractures); Future  Chronic renal disease, stage 3, moderately decreased glomerular filtration rate (GFR) between 30-59 mL/min/1.73 square meter (HCC) - His renal function has declined some over the last year.  I have asked him to avoid nephrotoxic agents and to continue to maintain control of his blood pressure and his blood sugars.  PSA elevation- I have asked him to return in 1  to 2 months to have this rechecked.  If it continues to rise then I will refer him to urology to consider a biopsy to screen for prostate cancer.   I am having Dshaun A. Mancel Bale "Clair Gulling" maintain his cetirizine, blood glucose meter kit and supplies, TRUEplus Lancets 30G, glucose blood, multivitamin, Cholecalciferol, dapagliflozin propanediol, Turmeric, olmesartan, gabapentin, atorvastatin, metFORMIN, Aspirin Low Dose, amLODipine, triamcinolone cream, and omega-3 acid ethyl esters.  Meds ordered this encounter  Medications  . omega-3 acid ethyl esters (LOVAZA) 1 g capsule    Sig: Take 2 capsules (2 g total) by mouth 2 (two) times daily.    Dispense:  360 capsule    Refill:  1     Follow-up: Return in about 6 months (around 12/08/2018).  Scarlette Calico, MD

## 2018-06-08 NOTE — Patient Instructions (Signed)
Type 2 Diabetes Mellitus, Diagnosis, Adult Type 2 diabetes (type 2 diabetes mellitus) is a long-term (chronic) disease. In type 2 diabetes, one or both of these problems may be present:  The pancreas does not make enough of a hormone called insulin.  Cells in the body do not respond properly to insulin that the body makes (insulin resistance). Normally, insulin allows blood sugar (glucose) to enter cells in the body. The cells use glucose for energy. Insulin resistance or lack of insulin causes excess glucose to build up in the blood instead of going into cells. As a result, high blood glucose (hyperglycemia) develops. What increases the risk? The following factors may make you more likely to develop type 2 diabetes:  Having a family member with type 2 diabetes.  Being overweight or obese.  Having an inactive (sedentary) lifestyle.  Having been diagnosed with insulin resistance.  Having a history of prediabetes, gestational diabetes, or polycystic ovary syndrome (PCOS).  Being of American-Indian, African-American, Hispanic/Latino, or Asian/Pacific Islander descent. What are the signs or symptoms? In the early stage of this condition, you may not have symptoms. Symptoms develop slowly and may include:  Increased thirst (polydipsia).  Increased hunger(polyphagia).  Increased urination (polyuria).  Increased urination during the night (nocturia).  Unexplained weight loss.  Frequent infections that keep coming back (recurring).  Fatigue.  Weakness.  Vision changes, such as blurry vision.  Cuts or bruises that are slow to heal.  Tingling or numbness in the hands or feet.  Dark patches on the skin (acanthosis nigricans). How is this diagnosed? This condition is diagnosed based on your symptoms, your medical history, a physical exam, and your blood glucose level. Your blood glucose may be checked with one or more of the following blood tests:  A fasting blood glucose (FBG)  test. You will not be allowed to eat (you will fast) for 8 hours or longer before a blood sample is taken.  A random blood glucose test. This test checks blood glucose at any time of day regardless of when you ate.  An A1c (hemoglobin A1c) blood test. This test provides information about blood glucose control over the previous 2-3 months.  An oral glucose tolerance test (OGTT). This test measures your blood glucose at two times: ? After fasting. This is your baseline blood glucose level. ? Two hours after drinking a beverage that contains glucose. You may be diagnosed with type 2 diabetes if:  Your FBG level is 126 mg/dL (7.0 mmol/L) or higher.  Your random blood glucose level is 200 mg/dL (11.1 mmol/L) or higher.  Your A1c level is 6.5% or higher.  Your OGTT result is higher than 200 mg/dL (11.1 mmol/L). These blood tests may be repeated to confirm your diagnosis. How is this treated? Your treatment may be managed by a specialist called an endocrinologist. Type 2 diabetes may be treated by following instructions from your health care provider about:  Making diet and lifestyle changes. This may include: ? Following an individualized nutrition plan that is developed by a diet and nutrition specialist (registered dietitian). ? Exercising regularly. ? Finding ways to manage stress.  Checking your blood glucose level as often as told.  Taking diabetes medicines or insulin daily. This helps to keep your blood glucose levels in the healthy range. ? If you use insulin, you may need to adjust the dosage depending on how physically active you are and what foods you eat. Your health care provider will tell you how to adjust your dosage.    Taking medicines to help prevent complications from diabetes, such as: ? Aspirin. ? Medicine to lower cholesterol. ? Medicine to control blood pressure. Your health care provider will set individualized treatment goals for you. Your goals will be based on  your age, other medical conditions you have, and how you respond to diabetes treatment. Generally, the goal of treatment is to maintain the following blood glucose levels:  Before meals (preprandial): 80-130 mg/dL (4.4-7.2 mmol/L).  After meals (postprandial): below 180 mg/dL (10 mmol/L).  A1c level: less than 7%. Follow these instructions at home: Questions to ask your health care provider  Consider asking the following questions: ? Do I need to meet with a diabetes educator? ? Where can I find a support group for people with diabetes? ? What equipment will I need to manage my diabetes at home? ? What diabetes medicines do I need, and when should I take them? ? How often do I need to check my blood glucose? ? What number can I call if I have questions? ? When is my next appointment? General instructions  Take over-the-counter and prescription medicines only as told by your health care provider.  Keep all follow-up visits as told by your health care provider. This is important.  For more information about diabetes, visit: ? American Diabetes Association (ADA): www.diabetes.org ? American Association of Diabetes Educators (AADE): www.diabeteseducator.org Contact a health care provider if:  Your blood glucose is at or above 240 mg/dL (13.3 mmol/L) for 2 days in a row.  You have been sick or have had a fever for 2 days or longer, and you are not getting better.  You have any of the following problems for more than 6 hours: ? You cannot eat or drink. ? You have nausea and vomiting. ? You have diarrhea. Get help right away if:  Your blood glucose is lower than 54 mg/dL (3.0 mmol/L).  You become confused or you have trouble thinking clearly.  You have difficulty breathing.  You have moderate or large ketone levels in your urine. Summary  Type 2 diabetes (type 2 diabetes mellitus) is a long-term (chronic) disease. In type 2 diabetes, the pancreas does not make enough of a  hormone called insulin, or cells in the body do not respond properly to insulin that the body makes (insulin resistance).  This condition is treated by making diet and lifestyle changes and taking diabetes medicines or insulin.  Your health care provider will set individualized treatment goals for you. Your goals will be based on your age, other medical conditions you have, and how you respond to diabetes treatment.  Keep all follow-up visits as told by your health care provider. This is important. This information is not intended to replace advice given to you by your health care provider. Make sure you discuss any questions you have with your health care provider. Document Released: 12/22/2004 Document Revised: 07/23/2016 Document Reviewed: 01/25/2015 Elsevier Interactive Patient Education  2019 [employer]  

## 2018-06-09 ENCOUNTER — Encounter: Payer: Self-pay | Admitting: Internal Medicine

## 2018-06-09 DIAGNOSIS — R972 Elevated prostate specific antigen [PSA]: Secondary | ICD-10-CM | POA: Insufficient documentation

## 2018-06-09 LAB — URINALYSIS, ROUTINE W REFLEX MICROSCOPIC
Bilirubin Urine: NEGATIVE
Hgb urine dipstick: NEGATIVE
Ketones, ur: NEGATIVE
Leukocytes,Ua: NEGATIVE
Nitrite: NEGATIVE
Specific Gravity, Urine: 1.025 (ref 1.000–1.030)
Total Protein, Urine: NEGATIVE
Urine Glucose: >=1000 — AB
Urobilinogen, UA: 0.2 (ref 0.0–1.0)
pH: 5.5 (ref 5.0–8.0)

## 2018-06-09 LAB — MICROALBUMIN / CREATININE URINE RATIO
Creatinine,U: 202.9 mg/dL
Microalb Creat Ratio: 0.4 mg/g (ref 0.0–30.0)
Microalb, Ur: 0.9 mg/dL (ref 0.0–1.9)

## 2018-06-13 ENCOUNTER — Other Ambulatory Visit: Payer: Self-pay | Admitting: Internal Medicine

## 2018-06-13 DIAGNOSIS — E119 Type 2 diabetes mellitus without complications: Secondary | ICD-10-CM

## 2018-06-13 DIAGNOSIS — E785 Hyperlipidemia, unspecified: Secondary | ICD-10-CM

## 2018-06-13 DIAGNOSIS — I10 Essential (primary) hypertension: Secondary | ICD-10-CM

## 2018-09-20 ENCOUNTER — Telehealth: Payer: Self-pay | Admitting: Internal Medicine

## 2018-09-20 NOTE — Telephone Encounter (Signed)
Called patient to schedule AWV, no answer, voicemail full. Will try to contact patient again at a later time. SF

## 2018-11-07 ENCOUNTER — Other Ambulatory Visit: Payer: Self-pay | Admitting: Internal Medicine

## 2018-11-07 DIAGNOSIS — E119 Type 2 diabetes mellitus without complications: Secondary | ICD-10-CM

## 2019-03-11 ENCOUNTER — Other Ambulatory Visit: Payer: Self-pay | Admitting: Internal Medicine

## 2019-03-11 DIAGNOSIS — I10 Essential (primary) hypertension: Secondary | ICD-10-CM

## 2021-03-03 ENCOUNTER — Encounter: Payer: Self-pay | Admitting: Gastroenterology
